# Patient Record
Sex: Male | Born: 1935 | Race: White | Hispanic: No | Marital: Married | Smoking: Never smoker
Health system: Southern US, Community
[De-identification: ages and names within clinical notes are randomized; demographics above are authoritative.]

## PROBLEM LIST (undated history)

## (undated) ENCOUNTER — Emergency Department: Discharge status: Absent without leave | Payer: Self-pay

## (undated) ENCOUNTER — Emergency Department: Discharge status: Absent without leave | Payer: Medicare Other

## (undated) DIAGNOSIS — N400 Enlarged prostate without lower urinary tract symptoms: Secondary | ICD-10-CM

---

## 2005-12-31 ENCOUNTER — Emergency Department: Payer: Self-pay | Admitting: Emergency Medicine

## 2005-12-31 ENCOUNTER — Other Ambulatory Visit: Payer: Self-pay

## 2006-11-08 ENCOUNTER — Ambulatory Visit: Payer: Self-pay | Admitting: Urology

## 2006-12-12 ENCOUNTER — Ambulatory Visit: Payer: Self-pay | Admitting: Internal Medicine

## 2006-12-14 ENCOUNTER — Ambulatory Visit: Payer: Self-pay | Admitting: Internal Medicine

## 2007-01-09 ENCOUNTER — Ambulatory Visit: Payer: Self-pay | Admitting: Internal Medicine

## 2007-01-11 ENCOUNTER — Ambulatory Visit: Payer: Self-pay | Admitting: Internal Medicine

## 2007-02-11 ENCOUNTER — Ambulatory Visit: Payer: Self-pay | Admitting: Internal Medicine

## 2007-04-13 ENCOUNTER — Ambulatory Visit: Payer: Self-pay | Admitting: Internal Medicine

## 2007-04-20 ENCOUNTER — Ambulatory Visit: Payer: Self-pay | Admitting: Internal Medicine

## 2007-04-24 ENCOUNTER — Ambulatory Visit: Payer: Self-pay | Admitting: Internal Medicine

## 2007-05-14 ENCOUNTER — Ambulatory Visit: Payer: Self-pay | Admitting: Internal Medicine

## 2007-07-14 ENCOUNTER — Ambulatory Visit: Payer: Self-pay | Admitting: Internal Medicine

## 2007-07-31 ENCOUNTER — Ambulatory Visit: Payer: Self-pay | Admitting: Internal Medicine

## 2007-08-13 ENCOUNTER — Ambulatory Visit: Payer: Self-pay | Admitting: Internal Medicine

## 2007-10-14 ENCOUNTER — Ambulatory Visit: Payer: Self-pay | Admitting: Internal Medicine

## 2007-10-30 ENCOUNTER — Ambulatory Visit: Payer: Self-pay | Admitting: Internal Medicine

## 2007-11-11 ENCOUNTER — Ambulatory Visit: Payer: Self-pay | Admitting: Internal Medicine

## 2007-11-23 ENCOUNTER — Ambulatory Visit: Payer: Self-pay | Admitting: Rheumatology

## 2008-01-05 ENCOUNTER — Emergency Department: Payer: Self-pay | Admitting: Emergency Medicine

## 2008-04-12 ENCOUNTER — Ambulatory Visit: Payer: Self-pay | Admitting: Internal Medicine

## 2009-12-31 ENCOUNTER — Ambulatory Visit: Payer: Self-pay

## 2013-08-26 ENCOUNTER — Emergency Department: Payer: Self-pay | Admitting: Emergency Medicine

## 2016-02-12 ENCOUNTER — Emergency Department
Admission: EM | Admit: 2016-02-12 | Discharge: 2016-02-13 | Disposition: A | Discharge status: Home / Self Care | Payer: Medicare Other | Attending: Emergency Medicine | Admitting: Emergency Medicine

## 2016-02-12 ENCOUNTER — Encounter: Payer: Self-pay | Admitting: Emergency Medicine

## 2016-02-12 DIAGNOSIS — N4889 Other specified disorders of penis: Secondary | ICD-10-CM | POA: Diagnosis present

## 2016-02-12 DIAGNOSIS — Z79899 Other long term (current) drug therapy: Secondary | ICD-10-CM | POA: Insufficient documentation

## 2016-02-12 DIAGNOSIS — N471 Phimosis: Secondary | ICD-10-CM | POA: Diagnosis not present

## 2016-02-12 DIAGNOSIS — Z7982 Long term (current) use of aspirin: Secondary | ICD-10-CM | POA: Diagnosis not present

## 2016-02-12 HISTORY — DX: Benign prostatic hyperplasia without lower urinary tract symptoms: N40.0

## 2016-02-12 NOTE — ED Notes (Signed)
Pt states "i had to go real bad, the pressure built up and it tore a new pathway out." pt states had urinary retention around 1400 today, with subsequent large urination. Pt states during urination foreskin to penis was torn. Pt is able to urinate without difficulty currently. Pt with small tear noted to underside of meatus.

## 2016-02-13 ENCOUNTER — Encounter: Payer: Self-pay | Admitting: Emergency Medicine

## 2016-02-13 LAB — URINALYSIS COMPLETE WITH MICROSCOPIC (ARMC ONLY)
BACTERIA UA: NONE SEEN
Bilirubin Urine: NEGATIVE
GLUCOSE, UA: NEGATIVE mg/dL
Ketones, ur: NEGATIVE mg/dL
Nitrite: NEGATIVE
PH: 5 (ref 5.0–8.0)
Protein, ur: NEGATIVE mg/dL
Specific Gravity, Urine: 1.009 (ref 1.005–1.030)

## 2016-02-13 LAB — BASIC METABOLIC PANEL
ANION GAP: 6 (ref 5–15)
BUN: 20 mg/dL (ref 6–20)
CALCIUM: 8.7 mg/dL — AB (ref 8.9–10.3)
CHLORIDE: 107 mmol/L (ref 101–111)
CO2: 28 mmol/L (ref 22–32)
Creatinine, Ser: 0.65 mg/dL (ref 0.61–1.24)
GFR calc non Af Amer: >60 mL/min (ref >60)
Glucose, Bld: 93 mg/dL (ref 65–99)
Potassium: 4 mmol/L (ref 3.5–5.1)
Sodium: 141 mmol/L (ref 135–145)

## 2016-02-13 NOTE — Discharge Instructions (Signed)
Follow-up with your doctor and UROLOGY.  SEEK Urgent MEDICAL CARE IF:  The torn area of skin does not heal, or it becomes more irritated, red, or bloody, develops pus or new swelling or pain Urination is difficult or painful or you can not urinate  Pain in the penis or lower abdomen develops You develop a fever. New Concerns arise

## 2016-02-13 NOTE — ED Provider Notes (Signed)
[facility] Emergency Department Provider Note  ____________________________________________  Time seen: Approximately 1:05 AM  I have reviewed the triage vital signs and the nursing notes.   HISTORY  Chief Complaint Penis Pain    HPI Jeffery Walsh. is a 82 y.o. 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  Patient reports that he's had slow increase in difficulty urinating because the hole in his foreskin has been slowly closing up and he usually has to use his fingers to pry it apart in order to urinate. Last night he went to urinate and he was unable to pry the foreskin apart, and he states that he had to go very badly and when he urinated the entire tip of his penis blew up like a balloon.  He now reports that he feels he can urinate fine, his bladder is emptying without issue but he did have a little bit of bleeding from the foreskin which is improved. He reports headache "put a bigger hole" in his foreskin.   Past Medical History  Diagnosis Date  . BPH (benign prostatic hyperplasia)     There are no active problems to display for this patient.   History reviewed. No pertinent past surgical history.  Current Outpatient Rx  Name  Route  Sig  Dispense  Refill  . aspirin 81 MG tablet   Oral   Take 81 mg by mouth daily.         . tamsulosin (FLOMAX) 0.4 MG CAPS capsule   Oral   Take 0.4 mg by mouth.           Allergies Review of patient's allergies indicates no known allergies.  History reviewed. No pertinent family history.  Social History Social History  Substance Use Topics  . Smoking status: Never Smoker   . Smokeless tobacco: Never Used  . Alcohol Use: No    Review of Systems Constitutional: No fever/chills Eyes: No visual changes. ENT: No sore throat. Cardiovascular: Denies chest pain. Respiratory: Denies shortness of breath. Gastrointestinal: No  abdominal pain.  No nausea, no vomiting.  No diarrhea.  No constipation. Genitourinary: See history of present illness Musculoskeletal: Negative for back pain. Skin: Negative for rash. Neurological: Negative for headaches, focal weakness or numbness.  10-point ROS otherwise negative.  ____________________________________________   PHYSICAL EXAM:  VITAL SIGNS: ED Triage Vitals  Enc Vitals Group     BP 02/12/16 2310 149/73 mmHg     Pulse Rate 02/12/16 2310 88     Resp 02/12/16 2310 16     Temp 02/12/16 2310 98.1 F (36.7 C)     Temp Source 02/12/16 2310 Oral     SpO2 02/12/16 2310 98 %     Weight 02/12/16 2310 150 lb (68.04 kg)     Height 02/12/16 2310 5\' 4"  (1.626 m)     Head Cir --      Peak Flow --      Pain Score 02/12/16 2310 2     Pain Loc --      Pain Edu? --      Excl. in [place]? --    Constitutional: Alert and oriented. Well appearing and in no acute distress.Very amicable, escorted by his daughter. Eyes: Conjunctivae are normal. PERRL. EOMI. Head: Atraumatic. Nose: No congestion/rhinnorhea. Mouth/Throat: Mucous membranes are moist.  Oropharynx non-erythematous. Neck: No stridor.  Cardiovascular: Normal rate, regular rhythm. Grossly normal heart sounds.  Good peripheral circulation. Respiratory:  Normal respiratory effort.  No retractions. Lungs CTAB. Gastrointestinal: Soft and nontender. No distention.  Normal shaft of the penis. The patient has a phimosis, no purulence or evidence of infection. It cannot be easily retracted down over the head of the penis but the patient reports that he has not been able to retract over his penis for years. At the distal portion, there is and appears to be a small tear in the foreskin consistent with the patient describes. There is no purulence, erythema or ongoing bleeding. Musculoskeletal: No lower extremity tenderness nor edema.  Neurologic:  Normal speech and language. No gross focal neurologic deficits are appreciated.Skin:  Skin  is warm, dry and intact. No rash noted. Psychiatric: Mood and affect are normal. Speech and behavior are normal.  ____________________________________________   LABS (all labs ordered are listed, but only abnormal results are displayed)  Labs Reviewed  URINALYSIS COMPLETEWITH MICROSCOPIC ([place]) - Abnormal; Notable for the following:    Color, Urine YELLOW (*)    APPearance HAZY (*)    Hgb urine dipstick 3+ (*)    Leukocytes, UA 1+ (*)    Squamous Epithelial / LPF 0-5 (*)    All other components within normal limits  BASIC METABOLIC PANEL - Abnormal; Notable for the following:    Calcium 8.7 (*)    All other components within normal limits   ____________________________________________  EKG   ____________________________________________  RADIOLOGY   ____________________________________________   PROCEDURES  Procedure(s) performed: None  Critical Care performed: No  ____________________________________________   INITIAL IMPRESSION / ASSESSMENT AND PLAN / ED COURSE  Pertinent labs & imaging results that were available during my care of the patient were reviewed by me and considered in my medical decision making (see chart for details).  Patient was followed appears to be apparent urinary obstruction secondary to some type of congenital or slow closure of the distal foreskin. Patient has had significant difficulty with urinating because of stricturing of the foreskin for years evidently. It appears that he likely ruptured the distal portion of his foreskin, but has no evidence of complication at this time. He is able to urinate well and empties the bladder with only 180 ML's resulting after voiding. He is awake alert, reports no further difficulty with urination and his labs appear quite normal. Expected mild blood is in the urinalysis. Discussed with the patient he will monitor his symptoms and follow-up with urology as I mentioned he may need further evaluation or  procedure to maintain patency.  Patient very agreeable. There is no evidence of paraphimosis, infection, or ongoing obstruction. ____________________________________________   FINAL CLINICAL IMPRESSION(S) / ED DIAGNOSES  Final diagnoses:  Congenital phimosis      Delman Kitten, MD 02/13/16 [phone]

## 2016-02-13 NOTE — ED Notes (Signed)
Pt assisted to use urinal.  3 bright red spots noted coming from patient's penis.  No other bleeding occurred at this time.

## 2016-02-15 ENCOUNTER — Telehealth: Payer: Self-pay | Admitting: Urology

## 2016-02-15 NOTE — Telephone Encounter (Signed)
Spoke with pt in reference to "penis problem". Pt stated that he went to the ER right after the "problem" happened. Pt would not go into further detail about problem and requested only a male physician to help. Pt was transferred to the front to make f/u appt.

## 2016-02-15 NOTE — Telephone Encounter (Signed)
Patient called the after hours nurse line on 02/12/16 at 8:49pm.  Patient states that he has a crack about an inch below the urethra opening that has opened up and now bleeding and urinating from it.  This occurred at approximately 8:00pm.  Patient states that he believes this is from pressure build up from foreskin.  Patient stated that he is uncomfortable but can tolerate.  Please contact patient to follow up.

## 2016-02-22 ENCOUNTER — Ambulatory Visit (INDEPENDENT_AMBULATORY_CARE_PROVIDER_SITE_OTHER): Payer: Medicare Other | Admitting: Urology

## 2016-02-22 ENCOUNTER — Encounter: Payer: Self-pay | Admitting: Urology

## 2016-02-22 VITALS — BP 106/68 | HR 99 | Ht 67.0 in | Wt 150.9 lb

## 2016-02-22 DIAGNOSIS — N471 Phimosis: Secondary | ICD-10-CM | POA: Diagnosis not present

## 2016-02-22 DIAGNOSIS — N419 Inflammatory disease of prostate, unspecified: Secondary | ICD-10-CM | POA: Insufficient documentation

## 2016-02-22 DIAGNOSIS — R3915 Urgency of urination: Secondary | ICD-10-CM | POA: Insufficient documentation

## 2016-02-22 DIAGNOSIS — M1990 Unspecified osteoarthritis, unspecified site: Secondary | ICD-10-CM | POA: Insufficient documentation

## 2016-02-22 DIAGNOSIS — N281 Cyst of kidney, acquired: Secondary | ICD-10-CM | POA: Insufficient documentation

## 2016-02-22 DIAGNOSIS — I6529 Occlusion and stenosis of unspecified carotid artery: Secondary | ICD-10-CM | POA: Insufficient documentation

## 2016-02-22 DIAGNOSIS — C8590 Non-Hodgkin lymphoma, unspecified, unspecified site: Secondary | ICD-10-CM | POA: Insufficient documentation

## 2016-02-22 DIAGNOSIS — E039 Hypothyroidism, unspecified: Secondary | ICD-10-CM | POA: Insufficient documentation

## 2016-02-22 DIAGNOSIS — I4510 Unspecified right bundle-branch block: Secondary | ICD-10-CM | POA: Insufficient documentation

## 2016-02-22 DIAGNOSIS — N529 Male erectile dysfunction, unspecified: Secondary | ICD-10-CM | POA: Insufficient documentation

## 2016-02-22 DIAGNOSIS — E785 Hyperlipidemia, unspecified: Secondary | ICD-10-CM | POA: Insufficient documentation

## 2016-02-22 LAB — URINALYSIS, COMPLETE
Bilirubin, UA: NEGATIVE
GLUCOSE, UA: NEGATIVE
Ketones, UA: NEGATIVE
Nitrite, UA: NEGATIVE
PROTEIN UA: NEGATIVE
Specific Gravity, UA: 1.02 (ref 1.005–1.030)
UUROB: 0.2 mg/dL (ref 0.2–1.0)
pH, UA: 5.5 (ref 5.0–7.5)

## 2016-02-22 LAB — MICROSCOPIC EXAMINATION
Epithelial Cells (non renal): >10 /hpf — AB (ref 0–10)
WBC, UA: >30 /hpf — AB (ref 0–?)

## 2016-02-22 LAB — BLADDER SCAN AMB NON-IMAGING: Scan Result: 16

## 2016-02-22 MED ORDER — CLOTRIMAZOLE-BETAMETHASONE 1-0.05 % EX CREA: 1.0000 "application " | TOPICAL_CREAM | Freq: Every day | CUTANEOUS | Status: DC | PRN

## 2016-02-22 NOTE — Progress Notes (Signed)
02/22/2016 10:10 AM   Jeffery Walsh. [birth year] PI:[id number]  Referring provider: Idelle Crouch, MD [facility] [place], [place] [address]  No chief complaint on file.   HPI:  1 - Phimosis - long h/o progressive phimosis with some ballooning of foreskin with void. Has beeen manually keeping open x years. On exam with pinpoint opening and dense phimotic ring. Glans not visibla at all. Brother had bad experience with dorsal slit procedure and pt is very hesitant towards any sort of surgical therapy (circumcsion) despite his relative excellent health. Givign trial of topical betamethasone 2017.   2 - Lower Urinary Tract Symptoms - on tamsulosin at baseline for mild LUTS. PVR 2017 <49mL.  PMH sig for ankel surgery, CEA. No strong blood thinners. No chest / abd surgery.  His PCP is Georgie Chard MD.  Today " Jeffery Walsh" is seen as new patient for above. He is referred by [facility] ER.    PMH: Past Medical History  Diagnosis Date  . BPH (benign prostatic hyperplasia)     Surgical History: No past surgical history on file.  Home Medications:    Medication List       This list is accurate as of: 02/22/16 10:10 AM.  Always use your most recent med list.               aspirin 81 MG tablet  Take 81 mg by mouth daily.     tamsulosin 0.4 MG Caps capsule  Commonly known as:  FLOMAX  Take 0.4 mg by mouth.        Allergies: No Known Allergies  Family History: No family history on file.  Social History:  reports that he has never smoked. He has never used smokeless tobacco. He reports that he does not drink alcohol or use illicit drugs.  ROS:    Urological Symptom Review  Patient is experiencing the following symptoms: Frequent urination   Review of Systems  Gastrointestinal (upper)  : Negative for upper GI symptoms  Gastrointestinal (lower) : Negative for lower GI symptoms  Constitutional : Negative for symptoms  Skin: Negative for  skin symptoms  Eyes: Negative for eye symptoms  Ear/Nose/Throat : Negative for Ear/Nose/Throat symptoms  Hematologic/Lymphatic: Negative for Hematologic/Lymphatic symptoms  Cardiovascular : Negative for cardiovascular symptoms  Respiratory : Negative for respiratory symptoms  Endocrine: Negative for endocrine symptoms  Musculoskeletal: Negative for musculoskeletal symptoms  Neurological: Negative for neurological symptoms  Psychologic: Negative for psychiatric symptoms      Physical Exam: There were no vitals taken for this visit.  Constitutional:  Alert and oriented, No acute distress.Very vigrous for age.  HEENT: [place] AT, moist mucus membranes.  Trachea midline, no masses. Cardiovascular: No clubbing, cyanosis, or edema. Respiratory: Normal respiratory effort, no increased work of breathing. GI: Abdomen is soft, nontender, nondistended, no abdominal masses GU: No CVA tenderness. Severe phimaosis, pinpoint opneing. Dense phimotic ring. Testes down w/o masses / hernias.  Skin: No rashes, bruises or suspicious lesions. Lymph: No cervical or inguinal adenopathy. Neurologic: Grossly intact, no focal deficits, moving all 4 extremities. Psychiatric: Normal mood and affect.  Laboratory Data: No results found for: WBC, HGB, HCT, MCV, PLT  Lab Results  Component Value Date   CREATININE 0.65 02/13/2016    No results found for: PSA  No results found for: TESTOSTERONE  No results found for: HGBA1C  Urinalysis    Component Value Date/Time   COLORURINE YELLOW* 02/13/2016 0027   APPEARANCEUR HAZY* 02/13/2016  0027   LABSPEC 1.009 02/13/2016 0027   PHURINE 5.0 02/13/2016 0027   GLUCOSEU NEGATIVE 02/13/2016 0027   HGBUR 3+* 02/13/2016 0027   BILIRUBINUR NEGATIVE 02/13/2016 0027   KETONESUR NEGATIVE 02/13/2016 0027   PROTEINUR NEGATIVE 02/13/2016 0027   NITRITE NEGATIVE 02/13/2016 0027   LEUKOCYTESUR 1+* 02/13/2016 0027    Pertinent Imaging: none  Assessment  & Plan:    1 - Phimosis - Pt with excellent functional status and minimal comorbidity. Frankly rec'd circumcsion. He is hesitant. Also discussed dorsal slit v. Topical steroid. He adamantly wants trial of steroid. Frankly discussed that this can at best stabilize current level of problem. Fortunatlye h eempties well.   2 - Lower Urinary Tract Symptoms - continue tamsulosin.   3 - RTC Urol prn.    No Follow-up on file.  Alexis Frock, [facility] [address], [place], [place] [address] [phone]

## 2016-03-04 ENCOUNTER — Ambulatory Visit: Payer: Self-pay

## 2016-03-22 ENCOUNTER — Emergency Department: Payer: Medicare Other

## 2016-03-22 ENCOUNTER — Encounter: Payer: Self-pay | Admitting: Emergency Medicine

## 2016-03-22 ENCOUNTER — Emergency Department
Admission: EM | Admit: 2016-03-22 | Discharge: 2016-03-22 | Disposition: A | Discharge status: Home / Self Care | Payer: Medicare Other | Attending: Emergency Medicine | Admitting: Emergency Medicine

## 2016-03-22 DIAGNOSIS — R0789 Other chest pain: Secondary | ICD-10-CM | POA: Diagnosis present

## 2016-03-22 DIAGNOSIS — Y929 Unspecified place or not applicable: Secondary | ICD-10-CM | POA: Insufficient documentation

## 2016-03-22 DIAGNOSIS — Z8679 Personal history of other diseases of the circulatory system: Secondary | ICD-10-CM | POA: Insufficient documentation

## 2016-03-22 DIAGNOSIS — Y9389 Activity, other specified: Secondary | ICD-10-CM | POA: Diagnosis not present

## 2016-03-22 DIAGNOSIS — S20211A Contusion of right front wall of thorax, initial encounter: Secondary | ICD-10-CM | POA: Insufficient documentation

## 2016-03-22 DIAGNOSIS — E039 Hypothyroidism, unspecified: Secondary | ICD-10-CM | POA: Diagnosis not present

## 2016-03-22 DIAGNOSIS — Z7982 Long term (current) use of aspirin: Secondary | ICD-10-CM | POA: Insufficient documentation

## 2016-03-22 DIAGNOSIS — S300XXA Contusion of lower back and pelvis, initial encounter: Secondary | ICD-10-CM | POA: Diagnosis not present

## 2016-03-22 DIAGNOSIS — W19XXXA Unspecified fall, initial encounter: Secondary | ICD-10-CM

## 2016-03-22 DIAGNOSIS — W108XXA Fall (on) (from) other stairs and steps, initial encounter: Secondary | ICD-10-CM | POA: Diagnosis not present

## 2016-03-22 DIAGNOSIS — Y999 Unspecified external cause status: Secondary | ICD-10-CM | POA: Insufficient documentation

## 2016-03-22 DIAGNOSIS — S20229A Contusion of unspecified back wall of thorax, initial encounter: Secondary | ICD-10-CM

## 2016-03-22 DIAGNOSIS — E785 Hyperlipidemia, unspecified: Secondary | ICD-10-CM | POA: Insufficient documentation

## 2016-03-22 MED ORDER — NAPROXEN 500 MG PO TABS
500.0000 mg | ORAL_TABLET | Freq: Once | ORAL | Status: AC
  Administered 2016-03-22: 500 mg via ORAL

## 2016-03-22 MED ORDER — OXYCODONE-ACETAMINOPHEN 5-325 MG PO TABS: 1.0000 | ORAL_TABLET | Freq: Four times a day (QID) | ORAL | Status: DC | PRN

## 2016-03-22 MED ORDER — NAPROXEN 500 MG PO TABS
ORAL_TABLET | ORAL | Status: AC
  Administered 2016-03-22: 500 mg via ORAL
  Filled 2016-03-22: qty 1

## 2016-03-22 MED ORDER — OXYCODONE-ACETAMINOPHEN 5-325 MG PO TABS
1.0000 | ORAL_TABLET | Freq: Once | ORAL | Status: AC
  Administered 2016-03-22: 1 via ORAL

## 2016-03-22 MED ORDER — OXYCODONE-ACETAMINOPHEN 5-325 MG PO TABS
ORAL_TABLET | ORAL | Status: AC
  Administered 2016-03-22: 1 via ORAL
  Filled 2016-03-22: qty 1

## 2016-03-22 NOTE — ED Notes (Signed)
Pt tripped today causing him to fall and injured right side ribs. No loc, denies any dizziness at time of fall.

## 2016-03-22 NOTE — Discharge Instructions (Signed)
You may take Tylenol or Motrin for mild to moderate pain.  You may take Percocet for severe pain, but do not drive within 8 hours of taking Percocet.  Please also take precautions to prevent falls as this medication may make you woozy or unsteady on your feet.  You may apply ice or heat, whichever feels better, to your back and chest wall for 10 minutes every 2-3 hours.  Use the incentive spirometer every hour when you are awake to prevent pneumonia.  Return to the emergency department if you develop cough, fever, changes in mental status, severe pain, shortness of breath, vomiting, or any other symptoms concerning to you. Back Injury Prevention Back injuries can be very painful. They can also be difficult to heal. After having one back injury, you are more likely to injure your back again. It is important to learn how to avoid injuring or re-injuring your back. The following tips can help you to prevent a back injury. WHAT SHOULD I KNOW ABOUT PHYSICAL FITNESS?  Exercise for 30 minutes per day on most days of the week or as directed by your health care provider. Make sure to:  Do aerobic exercises, such as walking, jogging, biking, or swimming.  Do exercises that increase balance and strength, such as tai chi and yoga. These can decrease your risk of falling and injuring your back.  Do stretching exercises to help with flexibility.  Try to develop strong abdominal muscles. Your abdominal muscles provide a lot of the support that is needed by your back.  Maintain a healthy weight. This helps to decrease your risk of a back injury. WHAT SHOULD I KNOW ABOUT MY DIET?  Talk with your health care provider about your overall diet. Take supplements and vitamins only as directed by your health care provider.  Talk with your health care provider about how much calcium and vitamin D you need each day. These nutrients help to prevent weakening of the bones (osteoporosis). Osteoporosis can cause broken  (fractured) bones, which lead to back pain.  Include good sources of calcium in your diet, such as dairy products, green leafy vegetables, and products that have had calcium added to them (fortified).  Include good sources of vitamin D in your diet, such as milk and foods that are fortified with vitamin D. WHAT SHOULD I KNOW ABOUT MY POSTURE?  Sit up straight and stand up straight. Avoid leaning forward when you sit or hunching over when you stand.  Choose chairs that have good low-back (lumbar) support.  If you work at a desk, sit close to it so you do not need to lean over. Keep your chin tucked in. Keep your neck drawn back, and keep your elbows bent at a right angle. Your arms should look like the letter "L."  Sit high and close to the steering wheel when you drive. Add a lumbar support to your car seat, if needed.  Avoid sitting or standing in one position for very long. Take breaks to get up, stretch, and walk around at least one time every hour. Take breaks every hour if you are driving for long periods of time.  Sleep on your side with your knees slightly bent, or sleep on your back with a pillow under your knees. Do not lie on the front of your body to sleep. WHAT SHOULD I KNOW ABOUT LIFTING, TWISTING, AND REACHING? Lifting and Heavy Lifting  Avoid heavy lifting, especially repetitive heavy lifting. If you must do heavy lifting:  Stretch  before lifting.  Work slowly.  Rest between lifts.  Use a tool such as a cart or a dolly to move objects if one is available.  Make several small trips instead of carrying one heavy load.  Ask for help when you need it, especially when moving big objects.  Follow these steps when lifting:  Stand with your feet shoulder-width apart.  Get as close to the object as you can. Do not try to pick up a heavy object that is far from your body.  Use handles or lifting straps if they are available.  Bend at your knees. Squat down, but keep  your heels off the floor.  Keep your shoulders pulled back, your chin tucked in, and your back straight.  Lift the object slowly while you tighten the muscles in your legs, abdomen, and buttocks. Keep the object as close to the center of your body as possible.  Follow these steps when putting down a heavy load:  Stand with your feet shoulder-width apart.  Lower the object slowly while you tighten the muscles in your legs, abdomen, and buttocks. Keep the object as close to the center of your body as possible.  Keep your shoulders pulled back, your chin tucked in, and your back straight.  Bend at your knees. Squat down, but keep your heels off the floor.  Use handles or lifting straps if they are available. Twisting and Reaching  Avoid lifting heavy objects above your waist.  Do not twist at your waist while you are lifting or carrying a load. If you need to turn, move your feet.  Do not bend over without bending at your knees.  Avoid reaching over your head, across a table, or for an object on a high surface. WHAT ARE SOME OTHER TIPS?  Avoid wet floors and icy ground. Keep sidewalks clear of ice to prevent falls.  Do not sleep on a mattress that is too soft or too hard.  Keep items that are used frequently within easy reach.  Put heavier objects on shelves at waist level, and put lighter objects on lower or higher shelves.  Find ways to decrease your stress, such as exercise, massage, or relaxation techniques. Stress can build up in your muscles. Tense muscles are more vulnerable to injury.  Talk with your health care provider if you feel anxious or depressed. These conditions can make back pain worse.  Wear flat heel shoes with cushioned soles.  Avoid sudden movements.  Use both shoulder straps when carrying a backpack.  Do not use any tobacco products, including cigarettes, chewing tobacco, or electronic cigarettes. If you need help quitting, ask your health care  provider.   This information is not intended to replace advice given to you by your health care provider. Make sure you discuss any questions you have with your health care provider.   Document Released: 10/06/2004 Document Revised: 01/13/2015 Document Reviewed: 09/02/2014 Elsevier Interactive Patient Education [employer].

## 2016-03-22 NOTE — ED Provider Notes (Signed)
[facility] Emergency Department Provider Note  ____________________________________________  Time seen: Approximately 8:01 PM  I have reviewed the triage vital signs and the nursing notes.   HISTORY  Chief Complaint Rib Injury    HPI Jeffery Walsh. is a 82 y.o. male , otherwise healthy and living independently, presenting with back pain and right lateral wall pain after a fall. The patient reports that he was carrying groceries when he missed a step, falling backwards and landing on a concrete step edge. He did not hit his head or lose consciousness. He is not anticoagulated. He has mid back pain without any numbness tingling or weakness, urinary incontinence or retention, fecal incontinence or retention. He also has right lateral thoracic wall pain that is worse with inspiration. He is not feeling short of breath. He does not have any central chest pain. At no point did he have any chest pain, shortness of breath or palpitations. No lightheadedness or syncope today. No recent illness.The patient was able to ambulate after his fall.   Past Medical History  Diagnosis Date  . BPH (benign prostatic hyperplasia)     Patient Active Problem List   Diagnosis Date Noted  . Phimosis 02/22/2016  . Urinary urgency 02/22/2016  . ED (erectile dysfunction) of organic origin 02/22/2016  . Carotid artery thrombosis 02/22/2016  . HLD (hyperlipidemia) 02/22/2016  . Adult hypothyroidism 02/22/2016  . Arthritis, degenerative 02/22/2016  . Lymphoma ([place]) 02/22/2016  . Kidney cysts 02/22/2016  . Prostatitis 02/22/2016  . Bundle branch block, right 02/22/2016    History reviewed. No pertinent past surgical history.  Current Outpatient Rx  Name  Route  Sig  Dispense  Refill  . aspirin 81 MG tablet   Oral   Take 81 mg by mouth daily.         . clotrimazole-betamethasone (LOTRISONE) cream   Topical   Apply 1 application topically daily as needed.   30 g   11    . tamsulosin (FLOMAX) 0.4 MG CAPS capsule   Oral   Take 0.4 mg by mouth.           Allergies Aspirin  No family history on file.  Social History Social History  Substance Use Topics  . Smoking status: Never Smoker   . Smokeless tobacco: Never Used  . Alcohol Use: No    Review of Systems Constitutional: No fever/chills.No lightheadedness or syncope. No loss of consciousness. Positive fall. Eyes: No visual changes. No blurred or double vision. ENT: No sore throat. No congestion or rhinorrhea. Cardiovascular: Denies chest pain. Denies palpitations. Respiratory: Denies shortness of breath.  No cough. Gastrointestinal: No abdominal pain.  No nausea, no vomiting.  No diarrhea.  No constipation. Genitourinary: Negative for dysuria. Musculoskeletal: Positive for back pain and right chest wall pain. Negative for hip pain. Skin: Negative for rash. Neurological: Negative for headaches. No focal numbness, tingling or weakness. No urinary or fecal incontinence or retention.  10-point ROS otherwise negative.  ____________________________________________   PHYSICAL EXAM:  VITAL SIGNS: ED Triage Vitals  Enc Vitals Group     BP 03/22/16 1804 140/51 mmHg     Pulse Rate 03/22/16 1804 78     Resp 03/22/16 1804 18     Temp 03/22/16 1804 98.3 F (36.8 C)     Temp Source 03/22/16 1804 Oral     SpO2 03/22/16 1804 98 %     Weight 03/22/16 1804 147 lb (66.679 kg)     Height 03/22/16 1804  5\' 4"  (1.626 m)     Head Cir --      Peak Flow --      Pain Score 03/22/16 1809 9     Pain Loc --      Pain Edu? --      Excl. in [place]? --     Constitutional: Alert and oriented. Well appearing and in no acute distress. Answers questions appropriately.GCS is 15. Eyes: Conjunctivae are normal.  EOMI. No scleral icterus. Head: Atraumatic.No raccoon eyes. No Battle sign. Nose: No congestion/rhinnorhea. Swelling over the nose. No septal hematoma. Mouth/Throat: Mucous membranes are moist. No  malocclusion or dental injury. Neck: No stridor.  Supple.  No midline C-spine tenderness to palpation, step-offs or deformities. Cardiovascular: Normal rate, regular rhythm. No murmurs, rubs or gallops.  Respiratory: Normal respiratory effort.  No accessory muscle use or retractions. Lungs CTAB.  No wheezes, rales or ronchi. There is an area of erythema or early bruising that is 4 x 4 inches on the right lateral chest wall without any skin disruption or crepitus, do not feel any unstable ribs. Gastrointestinal: Soft, nontender and nondistended.  No guarding or rebound.  No peritoneal signs. Musculoskeletal: No LE edema. Pelvis is stable. Full range of motion of bilateral hips without pain. Diffuse tenderness to palpation in the lower thoracic spine and the upper lumbar spine in the midline without obvious step-offs or deformities, no overlying ecchymosis, swelling or skin disruption. Neurologic:  A&Ox3.  Speech is clear.  Face and smile are symmetric.  EOMI.  Moves all extremities well. Normal sensation to the legs bilaterally. Skin:  Skin is warm, dry and intact. No rash noted. Psychiatric: Mood and affect are normal. Speech and behavior are normal.  Normal judgement  ____________________________________________   LABS (all labs ordered are listed, but only abnormal results are displayed)  Labs Reviewed - No data to display ____________________________________________  EKG  Not indicated ____________________________________________  RADIOLOGY  Dg Ribs Unilateral W/chest Right  03/22/2016  CLINICAL DATA:  Trip and fall with right-sided chest pain, initial encounter EXAM: RIGHT RIBS AND CHEST - 3+ VIEW COMPARISON:  None. FINDINGS: Cardiac shadow is within normal limits. The lungs are clear bilaterally. No pneumothorax is noted. Mild irregularity of the right eighth and ninth ribs is noted posteriorly. These appear to be related to healed rib fractures. No definitive acute fractures are  identified. Degenerative change of the thoracic spine is seen. IMPRESSION: Old healed right rib fractures.  No acute abnormality is noted. Electronically Signed   By: Inez Catalina M.D.   On: 03/22/2016 18:51   Ct Chest Wo Contrast  03/22/2016  CLINICAL DATA:  Trip and fall today with right-sided chest pain, initial encounter EXAM: CT CHEST WITHOUT CONTRAST TECHNIQUE: Multidetector CT imaging of the chest was performed following the standard protocol without IV contrast. COMPARISON:  None. FINDINGS: Cardiovascular: Diffuse aortic calcifications are noted without aneurysmal dilatation. No periaortic abnormality is seen. The cardiac structures are within normal limits. Mild coronary calcifications are noted. Mediastinum/Nodes: No significant lymphadenopathy is seen. The thoracic inlet is unremarkable. Lungs/Pleura: Lungs are well aerated bilaterally. No focal infiltrate or sizable effusion is seen. No parenchymal nodules are noted. Upper Abdomen: No acute abnormality noted. A left renal cyst is seen which measures 8.2 cm. Musculoskeletal: Degenerative changes of the thoracic spine are noted. Old rib fractures are again seen on the right. No acute fracture is identified. IMPRESSION: No acute abnormality noted. Electronically Signed   By: Inez Catalina M.D.   On:  03/22/2016 20:44   Ct Cervical Spine Wo Contrast  03/22/2016  CLINICAL DATA:  Recent fall on concrete with neck pain, initial encounter EXAM: CT CERVICAL SPINE WITHOUT CONTRAST TECHNIQUE: Multidetector CT imaging of the cervical spine was performed without intravenous contrast. Multiplanar CT image reconstructions were also generated. COMPARISON:  None. FINDINGS: Seven cervical segments are well visualized. Vertebral body height is well maintained. Multilevel osteophytic changes are noted from C4-C7. Facet hypertrophic changes are noted at all levels. No acute fracture or acute facet abnormality is noted. The surrounding soft tissues are within normal  limits. IMPRESSION: Multilevel degenerative change without acute abnormality. Electronically Signed   By: Inez Catalina M.D.   On: 03/22/2016 20:51   Ct Thoracic Spine Wo Contrast  03/22/2016  CLINICAL DATA:  Recent fall on concrete slab with right-sided chest pain, initial encounter EXAM: CT THORACIC SPINE WITHOUT CONTRAST TECHNIQUE: Multidetector CT imaging of the thoracic spine was performed without intravenous contrast administration. Multiplanar CT image reconstructions were also generated. COMPARISON:  None. FINDINGS: Vertebral body height is well maintained. Multilevel degenerative changes are seen. No acute compression deformity is seen. No paraspinal mass lesion is noted. IMPRESSION: Degenerative changes of the thoracic spine are noted. No acute compression deformity is seen. Electronically Signed   By: Inez Catalina M.D.   On: 03/22/2016 20:47   Ct Lumbar Spine Wo Contrast  03/22/2016  CLINICAL DATA:  Recent fall on concrete with low back pain, initial encounter EXAM: CT LUMBAR SPINE WITHOUT CONTRAST TECHNIQUE: Multidetector CT imaging of the lumbar spine was performed without intravenous contrast administration. Multiplanar CT image reconstructions were also generated. COMPARISON:  None. FINDINGS: Five lumbar type vertebral bodies are well visualized. Vertebral body height is well maintained. Multilevel disc space narrowing is noted with vacuum disc phenomena. Some mild retrolisthesis of L2 on L3 and L1 on L2 is noted of a degenerative nature. Significant facet hypertrophic changes with ligamentous hypertrophy as well as some disc bulging is noted at L4-5 with significant central canal stenosis as well as bilateral neural foraminal narrowing. IMPRESSION: No acute abnormality is noted. Multilevel degenerative changes are noted worst at L4-5 with significant central canal stenosis. Electronically Signed   By: Inez Catalina M.D.   On: 03/22/2016 20:50     ____________________________________________   PROCEDURES  Procedure(s) performed: None  Critical Care performed: No ____________________________________________   INITIAL IMPRESSION / ASSESSMENT AND PLAN / ED COURSE  Pertinent labs & imaging results that were available during my care of the patient were reviewed by me and considered in my medical decision making (see chart for details).  82 y.o. male after mechanical fall presenting with lower thoracic and upper lumbar pain as well as right lateral chest wall pain. The patient is significantly uncomfortable and I'll treat him symptomatically for his pain. We will get CT scans of the thoracic and lumbar spine, as well as of the chest to evaluate for rib fractures or spine fractures. At this time, the patient does not have any neurologic deficits.  ----------------------------------------- 9:12 PM on 03/22/2016 -----------------------------------------  The patient's trauma evaluation is negative and he does not have any acute fractures in the thoracic, lumbar or cervical spine, nor does he have any acute rib fractures. Given his age and the mother pain that he has however, I will give him an incentive spirometer to encourage him to prevent pneumonia. He will follow-up with his primary care physician closely. Return precautions and follow-up instructions were discussed. ____________________________________________  FINAL CLINICAL IMPRESSION(S) / ED DIAGNOSES  Final diagnoses:  Right-sided chest wall pain  Back contusion, unspecified laterality, initial encounter  Chest wall contusion, right, initial encounter  Fall, initial encounter      NEW MEDICATIONS STARTED DURING THIS VISIT:  New Prescriptions   No medications on file     Eula Listen, MD 03/22/16 2112

## 2016-03-22 NOTE — ED Notes (Signed)
Pt reports falling on back this afternoon onto concrete slab, reports right rib and back pain.  Pain increased with movement.  Denies SOB

## 2016-04-19 ENCOUNTER — Encounter: Payer: Self-pay | Admitting: Urology

## 2016-04-19 ENCOUNTER — Ambulatory Visit (INDEPENDENT_AMBULATORY_CARE_PROVIDER_SITE_OTHER): Payer: Medicare Other | Admitting: Urology

## 2016-04-19 VITALS — BP 146/68 | HR 83 | Ht 65.0 in | Wt 145.6 lb

## 2016-04-19 DIAGNOSIS — R3915 Urgency of urination: Secondary | ICD-10-CM | POA: Diagnosis not present

## 2016-04-19 LAB — MICROSCOPIC EXAMINATION

## 2016-04-19 LAB — URINALYSIS, COMPLETE
Bilirubin, UA: NEGATIVE
GLUCOSE, UA: NEGATIVE
NITRITE UA: NEGATIVE
UUROB: 0.2 mg/dL (ref 0.2–1.0)
pH, UA: 5 (ref 5.0–7.5)

## 2016-04-19 LAB — BLADDER SCAN AMB NON-IMAGING: SCAN RESULT: 40

## 2016-04-19 MED ORDER — ALFUZOSIN HCL ER 10 MG PO TB24: 10.0000 mg | ORAL_TABLET | Freq: Every day | ORAL | 11 refills | Status: DC

## 2016-04-19 NOTE — Progress Notes (Signed)
04/19/2016 3:18 PM   Jeffery Kudo Jr. 24-Apr-1918 [id number]  Referring provider: Idelle Crouch, MD [facility] [place], [place] [address]  Chief Complaint  Patient presents with  . Urinary Frequency    HPI: Jeffery Walsh is a 82yo seen today for urinary frequency and nocturia. He has longstanding LUTS and is currently on flomax. His LUTS have been present for over 10 years. He denies dysuria, urgency, urge incontinence. Hematuria. He notes if he does not drink any fluids at noon then he does not get up at night. He also has difficulty retracting his foreskin.    PMH: Past Medical History:  Diagnosis Date  . BPH (benign prostatic hyperplasia)     Surgical History: No past surgical history on file.  Home Medications:    Medication List       Accurate as of 04/19/16  3:18 PM. Always use your most recent med list.          aspirin 81 MG tablet Take 81 mg by mouth daily.   clotrimazole-betamethasone cream Commonly known as:  LOTRISONE Apply 1 application topically daily as needed.   naproxen sodium 220 MG tablet Commonly known as:  ANAPROX Take by mouth.   oxyCODONE-acetaminophen 5-325 MG tablet Commonly known as:  PERCOCET/ROXICET Take 1 tablet by mouth every 6 (six) hours as needed for severe pain.   tamsulosin 0.4 MG Caps capsule Commonly known as:  FLOMAX Take 0.4 mg by mouth.       Allergies:  Allergies  Allergen Reactions  . Aspirin Nausea Only    In high doses    Family History: No family history on file.  Social History:  reports that he has never smoked. He has never used smokeless tobacco. He reports that he does not drink alcohol or use drugs.  ROS: UROLOGY Frequent Urination?: Yes Hard to postpone urination?: Yes Burning/pain with urination?: Yes Get up at night to urinate?: Yes Leakage of urine?: Yes Urine stream starts and stops?: Yes Trouble starting stream?: No Do you have to strain to urinate?:  No Blood in urine?: No Urinary tract infection?: No Sexually transmitted disease?: No Injury to kidneys or bladder?: No Painful intercourse?: No Weak stream?: No Erection problems?: No Penile pain?: Yes  Gastrointestinal Nausea?: No Vomiting?: No Indigestion/heartburn?: No Diarrhea?: No Constipation?: Yes  Constitutional Fever: No Night sweats?: Yes Weight loss?: No Fatigue?: No  Skin Skin rash/lesions?: No Itching?: No  Eyes Blurred vision?: No Double vision?: No  Ears/Nose/Throat Sore throat?: No Sinus problems?: No  Hematologic/Lymphatic Swollen glands?: No Easy bruising?: No  Cardiovascular Leg swelling?: No Chest pain?: No  Respiratory Cough?: No Shortness of breath?: No  Endocrine Excessive thirst?: Yes  Musculoskeletal Back pain?: Yes Joint pain?: Yes  Neurological Headaches?: No Dizziness?: No  Psychologic Depression?: No Anxiety?: Yes  Physical Exam: BP (!) 146/68   Pulse 83   Ht 5\' 5"  (1.651 m)   Wt 66 kg (145 lb 9.6 oz)   BMI 24.23 kg/m   Constitutional:  Alert and oriented, No acute distress. HEENT: [place] AT, moist mucus membranes.  Trachea midline, no masses. Cardiovascular: No clubbing, cyanosis, or edema. Respiratory: Normal respiratory effort, no increased work of breathing. GI: Abdomen is soft, nontender, nondistended, no abdominal masses GU: No CVA tenderness. Dense phimosis. No masses/lesions on penis, testes, scrotum.  Skin: No rashes, bruises or suspicious lesions. Lymph: No cervical or inguinal adenopathy. Neurologic: Grossly intact, no focal deficits, moving all 4 extremities. Psychiatric: Normal  mood and affect.  Laboratory Data: No results found for: WBC, HGB, HCT, MCV, PLT  Lab Results  Component Value Date   CREATININE 0.65 02/13/2016    No results found for: PSA  No results found for: TESTOSTERONE  No results found for: HGBA1C  Urinalysis    Component Value Date/Time   COLORURINE YELLOW (A)  02/13/2016 0027   APPEARANCEUR Cloudy (A) 02/22/2016 1030   LABSPEC 1.009 02/13/2016 0027   PHURINE 5.0 02/13/2016 0027   GLUCOSEU Negative 02/22/2016 1030   HGBUR 3+ (A) 02/13/2016 0027   BILIRUBINUR Negative 02/22/2016 1030   KETONESUR NEGATIVE 02/13/2016 0027   PROTEINUR Negative 02/22/2016 1030   PROTEINUR NEGATIVE 02/13/2016 0027   NITRITE Negative 02/22/2016 1030   NITRITE NEGATIVE 02/13/2016 0027   LEUKOCYTESUR 3+ (A) 02/22/2016 1030    Pertinent Imaging: none  Assessment & Plan:    1. Urinary frequency/nocturia -uroxatral 10mg  daily  - Urinalysis, Complete  2. Phimosis - I recommended circumcision and the patient refused. He does not want a try steroid cream.   No Follow-up on file.  Nicolette Bang, MD  [facility] [address] [place], [place] [address] [phone]

## 2016-04-22 LAB — CULTURE, URINE COMPREHENSIVE

## 2016-05-24 ENCOUNTER — Ambulatory Visit (INDEPENDENT_AMBULATORY_CARE_PROVIDER_SITE_OTHER): Payer: Medicare Other | Admitting: Urology

## 2016-05-24 DIAGNOSIS — N471 Phimosis: Secondary | ICD-10-CM | POA: Diagnosis not present

## 2016-05-24 DIAGNOSIS — R300 Dysuria: Secondary | ICD-10-CM | POA: Diagnosis not present

## 2016-05-24 LAB — URINALYSIS, COMPLETE
BILIRUBIN UA: NEGATIVE
GLUCOSE, UA: NEGATIVE
KETONES UA: NEGATIVE
LEUKOCYTES UA: NEGATIVE
Nitrite, UA: NEGATIVE
PROTEIN UA: NEGATIVE
SPEC GRAV UA: 1.025 (ref 1.005–1.030)
Urobilinogen, Ur: 0.2 mg/dL (ref 0.2–1.0)
pH, UA: 5.5 (ref 5.0–7.5)

## 2016-05-24 LAB — MICROSCOPIC EXAMINATION: Bacteria, UA: NONE SEEN

## 2016-05-24 LAB — BLADDER SCAN AMB NON-IMAGING

## 2016-05-24 MED ORDER — CLOTRIMAZOLE-BETAMETHASONE 1-0.05 % EX CREA: 1.0000 "application " | TOPICAL_CREAM | Freq: Two times a day (BID) | CUTANEOUS | 0 refills | Status: DC

## 2016-05-24 MED ORDER — CEPHALEXIN 500 MG PO CAPS: 500.0000 mg | ORAL_CAPSULE | Freq: Two times a day (BID) | ORAL | 0 refills | Status: DC

## 2016-05-24 NOTE — Progress Notes (Signed)
I saw the patient briefly because of his severe phimosis. Due to the nurses inability to get the Foley catheter in she called my attention over to the patient. I used a small Kelly clamp and dilated the phimosis enough so that we could see the glans and the urethral meatus. We subsequently performed a straight cath to get a clean urine sample which was sent for culture. The patient opted to have a Foley catheter placed this time and so that straight was exchanged for a 16 [place] Foley catheter. We opted to give the patient some steroid cream for his phimosis. We also started the patient on Keflex 500 mg twice a day. Patient will return in 1 week to have his Foley catheter removed. I shortly recommended that the patient consider a dorsal slit so that this problem can be avoided in the future.

## 2016-05-24 NOTE — Progress Notes (Signed)
Patient came in today stating that he is having dysuria and frequency. Patient has a history of phimosis so he was asked if he could retract his foreskin and he stated he cannot. It was explained that we would want a cath specimen to avoid contamination.  Upon exam phimosis was noted could not visualize patient's urethra. Dr. Louis Meckel was asked into the room to examine patient.  PVR was preformed 148ml  In and Out Catheterization  Patient is present today for a I & O catheterization due to phimosis and dysuria. Patient was cleaned and prepped in a sterile fashion with betadine and Lidocaine 2% jelly was instilled into the urethra.  A 14FR cath was inserted after Dr. Louis Meckel was able to open the foreskin with forcepts , 66ml of urine return was noted, urine was yellow in color. A clean urine sample was collected for UA and culture. Bladder was drained  And catheter was removed with out difficulty.    Preformed by: Fonnie Jarvis, CMA and Burman Nieves, MD  After discussion with patient it was agreed for patient to have a foley placed for a week per Dr. Louis Meckel and a script for antibiotics and cream was sent to patient's pharmacy. Patient and patient's son were given detailed instruction on cath care.  Simple Catheter Placement  Due to urinary retention patient is present today for a foley cath placement.  Patient was cleaned and prepped in a sterile fashion with betadine and lidocaine jelly 2% was instilled into the urethra.  A 16 FR foley catheter was inserted, urine return was noted  47ml, urine was yellow  in color.  The balloon was filled with 10cc of sterile water.  A leg bag was attached for drainage. Patient was also given a night bag to take home and was given instruction on how to change from one bag to another.  Patient was given instruction on proper catheter care.  Patient tolerated well, no complications were noted   Preformed by: Fonnie Jarvis, CMA  Additional notes/ Follow up: 1 wk foley  removal and discussion of dorsal slit

## 2016-05-29 LAB — CULTURE, URINE COMPREHENSIVE

## 2016-05-30 ENCOUNTER — Ambulatory Visit: Payer: Medicare Other

## 2016-05-30 DIAGNOSIS — N471 Phimosis: Secondary | ICD-10-CM

## 2016-05-30 NOTE — Progress Notes (Signed)
Pt called the after hours line over the weekend c/o urine coming out around his catheter. Per pt after hours triage advised pt to come into our office first thing this morning.  Per Dr. Tresa Moore pt foley could be removed today. Reinforced with pt to keep f/u with MD to discuss dorsal slit. Pt and daughter voiced understanding.

## 2016-05-31 ENCOUNTER — Ambulatory Visit: Payer: Medicare Other

## 2016-06-14 ENCOUNTER — Encounter: Payer: Self-pay | Admitting: Urology

## 2016-06-14 ENCOUNTER — Ambulatory Visit (INDEPENDENT_AMBULATORY_CARE_PROVIDER_SITE_OTHER): Payer: Medicare Other | Admitting: Urology

## 2016-06-14 VITALS — BP 119/62 | HR 80 | Ht 64.0 in | Wt 147.5 lb

## 2016-06-14 DIAGNOSIS — N471 Phimosis: Secondary | ICD-10-CM

## 2016-06-14 MED ORDER — CLOTRIMAZOLE-BETAMETHASONE 1-0.05 % EX CREA: 1.0000 "application " | TOPICAL_CREAM | Freq: Two times a day (BID) | CUTANEOUS | 3 refills | Status: DC

## 2016-06-14 NOTE — Progress Notes (Signed)
06/14/2016 2:13 PM   Dewain Penning. May 26, 1918 [id number]  Referring provider: Idelle Crouch, MD [facility] [place], [place] [address]  Chief Complaint  Patient presents with  . Follow-up    Phimosis    HPI: Mr Jeffery Walsh is a 82yo here for followup for phimosis. He had a foley placed for retention and it has since been removed. He is using the cream BID.    PMH: Past Medical History:  Diagnosis Date  . BPH (benign prostatic hyperplasia)     Surgical History: No past surgical history on file.  Home Medications:    Medication List       Accurate as of 06/14/16  2:13 PM. Always use your most recent med list.          alfuzosin 10 MG 24 hr tablet Commonly known as:  UROXATRAL Take 1 tablet (10 mg total) by mouth daily with breakfast.   aspirin 81 MG tablet Take 81 mg by mouth daily.   clotrimazole-betamethasone cream Commonly known as:  LOTRISONE Apply 1 application topically 2 (two) times daily.   naproxen sodium 220 MG tablet Commonly known as:  ANAPROX Take by mouth.   tamsulosin 0.4 MG Caps capsule Commonly known as:  FLOMAX Take by mouth.       Allergies:  Allergies  Allergen Reactions  . Aspirin Nausea Only    In high doses    Family History: No family history on file.  Social History:  reports that he has never smoked. He has never used smokeless tobacco. He reports that he does not drink alcohol or use drugs.  ROS: UROLOGY Frequent Urination?: Yes Hard to postpone urination?: Yes Burning/pain with urination?: No Get up at night to urinate?: Yes Leakage of urine?: Yes Urine stream starts and stops?: Yes Trouble starting stream?: No Do you have to strain to urinate?: No Blood in urine?: No Urinary tract infection?: No Sexually transmitted disease?: No Injury to kidneys or bladder?: No Painful intercourse?: No Weak stream?: No Erection problems?: No Penile pain?: No  Gastrointestinal Nausea?:  No Vomiting?: No Indigestion/heartburn?: No Diarrhea?: No Constipation?: No  Constitutional Fever: No Night sweats?: No Weight loss?: No Fatigue?: No  Skin Skin rash/lesions?: No Itching?: No  Eyes Blurred vision?: No Double vision?: No  Ears/Nose/Throat Sore throat?: No Sinus problems?: No  Hematologic/Lymphatic Swollen glands?: No Easy bruising?: No  Cardiovascular Leg swelling?: No Chest pain?: No  Respiratory Cough?: No Shortness of breath?: No  Endocrine Excessive thirst?: No  Musculoskeletal Back pain?: Yes Joint pain?: Yes  Neurological Headaches?: No Dizziness?: No  Psychologic Depression?: No Anxiety?: No  Physical Exam: BP 119/62   Pulse 80   Ht 5\' 4"  (1.626 m)   Wt 66.9 kg (147 lb 8 oz)   BMI 25.32 kg/m   Constitutional:  Alert and oriented, No acute distress. HEENT: [place] AT, moist mucus membranes.  Trachea midline, no masses. Cardiovascular: No clubbing, cyanosis, or edema. Respiratory: Normal respiratory effort, no increased work of breathing. GI: Abdomen is soft, nontender, nondistended, no abdominal masses GU: No CVA tenderness. Uncircumcised. Dense phimotic ring Skin: No rashes, bruises or suspicious lesions. Lymph: No cervical or inguinal adenopathy. Neurologic: Grossly intact, no focal deficits, moving all 4 extremities. Psychiatric: Normal mood and affect.  Laboratory Data: No results found for: WBC, HGB, HCT, MCV, PLT  Lab Results  Component Value Date   CREATININE 0.65 02/13/2016    No results found for: PSA  No results found  for: TESTOSTERONE  No results found for: HGBA1C  Urinalysis    Component Value Date/Time   COLORURINE YELLOW (A) 02/13/2016 0027   APPEARANCEUR Clear 05/24/2016 1332   LABSPEC 1.009 02/13/2016 0027   PHURINE 5.0 02/13/2016 0027   GLUCOSEU Negative 05/24/2016 1332   HGBUR 3+ (A) 02/13/2016 0027   BILIRUBINUR Negative 05/24/2016 1332   KETONESUR NEGATIVE 02/13/2016 0027   PROTEINUR  Negative 05/24/2016 1332   PROTEINUR NEGATIVE 02/13/2016 0027   NITRITE Negative 05/24/2016 1332   NITRITE NEGATIVE 02/13/2016 0027   LEUKOCYTESUR Negative 05/24/2016 1332    Pertinent Imaging: none  Assessment & Plan:   1. Phimosis -continue steroid crema -RTC 3 months - Bladder Scan (Post Void Residual) in office   No Follow-up on file.  Nicolette Bang, MD  [facility] [address], [place], [place] [address] [phone]

## 2016-06-22 ENCOUNTER — Telehealth: Payer: Self-pay

## 2016-06-22 ENCOUNTER — Ambulatory Visit: Payer: Medicare Other

## 2016-06-22 VITALS — BP 115/61 | HR 78 | Temp 97.6°F | Wt 149.3 lb

## 2016-06-22 DIAGNOSIS — N471 Phimosis: Secondary | ICD-10-CM

## 2016-06-22 DIAGNOSIS — N390 Urinary tract infection, site not specified: Secondary | ICD-10-CM

## 2016-06-22 LAB — URINALYSIS, COMPLETE
BILIRUBIN UA: NEGATIVE
Glucose, UA: NEGATIVE
Nitrite, UA: NEGATIVE
PH UA: 5.5 (ref 5.0–7.5)
Specific Gravity, UA: >1.03 — ABNORMAL HIGH (ref 1.005–1.030)
Urobilinogen, Ur: 0.2 mg/dL (ref 0.2–1.0)

## 2016-06-22 LAB — MICROSCOPIC EXAMINATION
Epithelial Cells (non renal): NONE SEEN /hpf (ref 0–10)
RBC, UA: >30 /hpf — ABNORMAL HIGH (ref 0–?)
WBC, UA: >30 /hpf — ABNORMAL HIGH (ref 0–?)

## 2016-06-22 MED ORDER — CEPHALEXIN 500 MG PO CAPS: 500.0000 mg | ORAL_CAPSULE | Freq: Four times a day (QID) | ORAL | 0 refills | Status: DC

## 2016-06-22 NOTE — Progress Notes (Signed)
Pt came in today with c/o urinary frequency and urgency, hard to postpone urination, dysuria, leakage of urine, and lower abd pain. Pt has been on abx in the last 30 days due to urethral dilation in the ER. Pt kept a foley for a week and then it was removed. A clean catch urine was obtained for a u/a and cx.    Blood pressure 115/61, pulse 78, temperature 97.6 F (36.4 C), weight 149 lb 4.8 oz (67.7 kg).

## 2016-06-22 NOTE — Telephone Encounter (Signed)
LMOM-medication sent to pharmacy 

## 2016-06-23 ENCOUNTER — Telehealth: Payer: Self-pay | Admitting: Family Medicine

## 2016-06-23 ENCOUNTER — Other Ambulatory Visit: Payer: Self-pay | Admitting: Family Medicine

## 2016-06-23 NOTE — Telephone Encounter (Signed)
Patient notified and will come in after he has finished ABX for Cath Urine Culture

## 2016-06-23 NOTE — Telephone Encounter (Signed)
Patient's son and patient called the office.    Information regarding medication (Keflex) called into the [facility].  Patient's son expressed understanding and stated that he would pick up the prescription today.

## 2016-06-23 NOTE — Telephone Encounter (Signed)
-----   Message from Hollice Espy, MD sent at 06/22/2016  2:58 PM EDT ----- Keflex 500 mg qid x 7 days.  F/u UCx.  This is a non catherized specimen thought tight phimosis therefore likely contaminated but symptomatic.    Hollice Espy, MD

## 2016-06-27 LAB — CULTURE, URINE COMPREHENSIVE

## 2016-06-28 ENCOUNTER — Telehealth: Payer: Self-pay

## 2016-06-28 DIAGNOSIS — N390 Urinary tract infection, site not specified: Secondary | ICD-10-CM

## 2016-06-28 MED ORDER — CIPROFLOXACIN HCL 500 MG PO TABS: 500.0000 mg | ORAL_TABLET | Freq: Two times a day (BID) | ORAL | 0 refills | Status: DC

## 2016-06-28 NOTE — Telephone Encounter (Signed)
Spoke with pt in reference to ucx results. Made aware will need to add cipro to bactrim. Pt stated that he understands but wants son to be contacted. Pt stated he will have son call [facility]. Medication sent to pharmacy.

## 2016-06-28 NOTE — Telephone Encounter (Signed)
-----   Message from Hollice Espy, MD sent at 06/27/2016  4:16 PM EDT ----- Please complete Keflex course already prescribed but also we need to add Cipro 500 mg twice a day 7 days in addition to this he also grew Pseudomonas.  Hollice Espy, MD

## 2016-06-29 ENCOUNTER — Emergency Department
Admission: EM | Admit: 2016-06-29 | Discharge: 2016-06-29 | Payer: Medicare Other | Attending: Emergency Medicine | Admitting: Emergency Medicine

## 2016-06-29 ENCOUNTER — Encounter: Payer: Self-pay | Admitting: Emergency Medicine

## 2016-06-29 ENCOUNTER — Emergency Department: Payer: Medicare Other

## 2016-06-29 DIAGNOSIS — Y9389 Activity, other specified: Secondary | ICD-10-CM | POA: Insufficient documentation

## 2016-06-29 DIAGNOSIS — S1202XA Unstable burst fracture of first cervical vertebra, initial encounter for closed fracture: Secondary | ICD-10-CM

## 2016-06-29 DIAGNOSIS — Y929 Unspecified place or not applicable: Secondary | ICD-10-CM | POA: Diagnosis not present

## 2016-06-29 DIAGNOSIS — E039 Hypothyroidism, unspecified: Secondary | ICD-10-CM | POA: Insufficient documentation

## 2016-06-29 DIAGNOSIS — S1201XA Stable burst fracture of first cervical vertebra, initial encounter for closed fracture: Secondary | ICD-10-CM | POA: Insufficient documentation

## 2016-06-29 DIAGNOSIS — S0083XA Contusion of other part of head, initial encounter: Secondary | ICD-10-CM | POA: Insufficient documentation

## 2016-06-29 DIAGNOSIS — W19XXXA Unspecified fall, initial encounter: Secondary | ICD-10-CM

## 2016-06-29 DIAGNOSIS — Y999 Unspecified external cause status: Secondary | ICD-10-CM | POA: Insufficient documentation

## 2016-06-29 DIAGNOSIS — Z7982 Long term (current) use of aspirin: Secondary | ICD-10-CM | POA: Diagnosis not present

## 2016-06-29 DIAGNOSIS — W01198A Fall on same level from slipping, tripping and stumbling with subsequent striking against other object, initial encounter: Secondary | ICD-10-CM | POA: Diagnosis not present

## 2016-06-29 DIAGNOSIS — Z791 Long term (current) use of non-steroidal anti-inflammatories (NSAID): Secondary | ICD-10-CM | POA: Insufficient documentation

## 2016-06-29 DIAGNOSIS — S0990XA Unspecified injury of head, initial encounter: Secondary | ICD-10-CM | POA: Diagnosis present

## 2016-06-29 MED ORDER — FENTANYL CITRATE (PF) 100 MCG/2ML IJ SOLN
25.0000 ug | Freq: Once | INTRAMUSCULAR | Status: AC
  Administered 2016-06-29: 25 ug via INTRAVENOUS
  Filled 2016-06-29: qty 2

## 2016-06-29 MED ORDER — SODIUM CHLORIDE 0.9 % IV SOLN
Freq: Once | INTRAVENOUS | Status: AC
  Administered 2016-06-29: 23:00:00 via INTRAVENOUS

## 2016-06-29 NOTE — ED Provider Notes (Signed)
[facility] Emergency Department Provider Note  ____________________________________________  Time seen: Approximately 7:27 PM  I have reviewed the triage vital signs and the nursing notes.   HISTORY  Chief Complaint Fall and Neck Pain   HPI Jeffery Alessandro. is a 82 y.o. male history of arthritis, hypothyroidism, and BPH who presents for evaluation of a right forehead hematoma status post mechanical fall. Patient was at [employer] visiting his wife when he tripped and fell hitting his head on the floor. The fall was witnessed and mechanical in nature. Patient denies LOC. Not on anticoagulation. Patient is complaining of pain located in the left lateral neck that has been present since the fall, constant, nonradiating, worse with palpation. Patient denies any other pain at this time.  Past Medical History:  Diagnosis Date  . BPH (benign prostatic hyperplasia)     Patient Active Problem List   Diagnosis Date Noted  . Phimosis 02/22/2016  . Urinary urgency 02/22/2016  . ED (erectile dysfunction) of organic origin 02/22/2016  . Carotid artery thrombosis 02/22/2016  . HLD (hyperlipidemia) 02/22/2016  . Adult hypothyroidism 02/22/2016  . Arthritis, degenerative 02/22/2016  . Lymphoma ([place]) 02/22/2016  . Kidney cysts 02/22/2016  . Prostatitis 02/22/2016  . Bundle branch block, right 02/22/2016    History reviewed. No pertinent surgical history.  Prior to Admission medications   Medication Sig Start Date End Date Taking? Authorizing Provider  alfuzosin (UROXATRAL) 10 MG 24 hr tablet Take 1 tablet (10 mg total) by mouth daily with breakfast. 04/19/16   Cleon Gustin, MD  aspirin 81 MG tablet Take 81 mg by mouth daily.    Historical Provider, MD  cephALEXin (KEFLEX) 500 MG capsule Take 1 capsule (500 mg total) by mouth 4 (four) times daily. 06/22/16   Hollice Espy, MD  ciprofloxacin (CIPRO) 500 MG tablet Take 1 tablet (500 mg total) by mouth  every 12 (twelve) hours. 06/28/16   Hollice Espy, MD  clotrimazole-betamethasone (LOTRISONE) cream Apply 1 application topically 2 (two) times daily. 06/14/16   Cleon Gustin, MD  naproxen sodium (ANAPROX) 220 MG tablet Take by mouth.    Historical Provider, MD  tamsulosin (FLOMAX) 0.4 MG CAPS capsule Take by mouth. 06/01/16   Historical Provider, MD    Allergies Aspirin  History reviewed. No pertinent family history.  Social History Social History  Substance Use Topics  . Smoking status: Never Smoker  . Smokeless tobacco: Never Used  . Alcohol use No    Review of Systems Constitutional: Negative for fever. Eyes: Negative for visual changes. ENT: Negative for facial injury. + neck injury Cardiovascular: Negative for chest injury. Respiratory: Negative for shortness of breath. Negative for chest wall injury. Gastrointestinal: Negative for abdominal pain or injury. Genitourinary: Negative for dysuria. Musculoskeletal: Negative for back injury, negative for arm or leg pain. Skin: Negative for laceration/abrasions. Neurological: + head injury.  ____________________________________________   PHYSICAL EXAM:  VITAL SIGNS: ED Triage Vitals  Enc Vitals Group     BP 06/29/16 1923 (!) 167/78     Pulse Rate 06/29/16 1923 93     Resp 06/29/16 1923 16     Temp --      Temp src --      SpO2 06/29/16 1923 99 %     Weight 06/29/16 1925 160 lb (72.6 kg)     Height 06/29/16 1925 5\' 6"  (1.676 m)     Head Circumference --      [place] --  Pain Score 06/29/16 1925 5     Pain Loc --      Pain Edu? --      Excl. in [place]? --    Full spinal precautions maintained throughout the trauma exam. Constitutional: Alert and oriented. No acute distress. Does not appear intoxicated. HEENT Head: Normocephalic and R forehead hematoma. Face: No facial bony tenderness. Stable midface Ears: Impacted wax bilaterally, unable to visualize TM. No Battle sign Eyes: No eye injury. PERRL. No  raccoon eyes Nose: Nontender. No epistaxis. No rhinorrhea Mouth/Throat: Mucous membranes are moist. No oropharyngeal blood. No dental injury. Airway patent without stridor. Normal voice. Neck: C-collar in place. No midline c-spine tenderness. Left sided paraspinal ttp Cardiovascular: Normal rate, regular rhythm. Normal and symmetric distal pulses are present in all extremities. Pulmonary/Chest: Chest wall is stable and nontender to palpation/compression. Normal respiratory effort. Breath sounds are normal. No crepitus.  Abdominal: Soft, nontender, non distended. Musculoskeletal: Nontender with normal full range of motion in all extremities. No deformities. No thoracic or lumbar midline spinal tenderness. Pelvis is stable. Skin: Skin is warm, dry and intact. No abrasions or contutions. Psychiatric: Speech and behavior are appropriate. Neurological: Normal speech and language. Moves all extremities to command. No gross focal neurologic deficits are appreciated.  Glascow Coma Score: 4 - Opens eyes on own 6 - Follows simple motor commands 5 - Alert and oriented GCS: 15   ____________________________________________   LABS (all labs ordered are listed, but only abnormal results are displayed)  Labs Reviewed - No data to display ____________________________________________  EKG  ED ECG REPORT I, Rudene Re, the attending physician, personally viewed and interpreted this ECG.  Sinus rhythm, rate of 67, right bundle branch block, normal QTC, normal interval, no ST elevations or depressions. Unchanged from 2007 ____________________________________________  RADIOLOGY  CT head and c-spine: 1. Comminuted left lateral mass fracture at C1. 2. 2 mm displaced posterior arch C1 fracture. 3. No additional fractures within the cervical spine. 4. Advanced spondylosis in the cervical spine. 5. Normal CT appearance the brain. 6. Right frontal scalp hematoma without underlying  fracture. ____________________________________________   PROCEDURES  Procedure(s) performed: None Procedures Critical Care performed:  None ____________________________________________   INITIAL IMPRESSION / ASSESSMENT AND PLAN / ED COURSE  82 y.o. male history of arthritis, hypothyroidism, and BPH who presents for evaluation of a right forehead hematoma status post mechanical fall. Patient also complaining of the left paraspinal tenderness of his neck, no midline tenderness. No other injuries. No signs and symptoms of basilar skull fracture. Patient is not anticoagulated. Plan for head CT and CT cervical spine. We'll give fentanyl for the pain.  Clinical Course  Comment By Time  CT concerning for C1 burst fracture. Patient neurologically intact. Images sent to Dr. Lucile Shutters, ortho/ spine at [facility]. Rudene Re, MD 10/18 2042  Spoke with Dr. Lucile Shutters, ortho/ spine at [facility] who will evaluate patient in the ED. Spoke with Dr. Lawrence Marseilles, ED physician who accepted transfer to the ED. Patient remains neuro intact.  Rudene Re, MD 10/18 2231   Patient left ED neurologically intact via EMS en route to [facility].  Pertinent labs & imaging results that were available during my care of the patient were reviewed by me and considered in my medical decision making (see chart for details).    ____________________________________________   FINAL CLINICAL IMPRESSION(S) / ED DIAGNOSES  Final diagnoses:  Closed unstable burst fracture of first cervical vertebra, initial encounter ([place])  Fall, initial encounter  Traumatic hematoma of forehead,  initial encounter      NEW MEDICATIONS STARTED DURING THIS VISIT:  Discharge Medication List as of 06/29/2016 10:42 PM       Note:  This document was prepared using Dragon voice recognition software and may include unintentional dictation errors.    Rudene Re, MD 06/30/16 [phone]

## 2016-06-29 NOTE — ED Notes (Signed)
Report called to [facility] ER to [place], [place]. Report given to EMS crew upon their arrival to ER. Patient transferred from ER stretcher to EMS stretcher without incidence. Prior to and after transfer to EMS stretcher, pt moving all four extremities independently. Pt reporting no difficulty breathing prior to transport.

## 2016-06-29 NOTE — ED Triage Notes (Signed)
Pt arrived via EMS from [employer] where he was visiting his wife. Pt reports that he tripped over his walker and fell and hit his head. Pt has large hematoma to forehead above right eye and is complaining of neck pain. Pt arrives with c-collar in place. Pt denies LOC, back pain, chest pain, SOB, N/V, blurred vision, dizziness.

## 2016-06-30 DIAGNOSIS — W19XXXA Unspecified fall, initial encounter: Secondary | ICD-10-CM | POA: Insufficient documentation

## 2016-06-30 DIAGNOSIS — N400 Enlarged prostate without lower urinary tract symptoms: Secondary | ICD-10-CM | POA: Insufficient documentation

## 2016-06-30 DIAGNOSIS — S12000A Unspecified displaced fracture of first cervical vertebra, initial encounter for closed fracture: Secondary | ICD-10-CM | POA: Insufficient documentation

## 2016-06-30 DIAGNOSIS — Z8673 Personal history of transient ischemic attack (TIA), and cerebral infarction without residual deficits: Secondary | ICD-10-CM | POA: Insufficient documentation

## 2016-09-16 ENCOUNTER — Ambulatory Visit: Payer: Medicare Other

## 2016-10-07 ENCOUNTER — Ambulatory Visit (INDEPENDENT_AMBULATORY_CARE_PROVIDER_SITE_OTHER): Payer: Medicare Other | Admitting: Urology

## 2016-10-07 ENCOUNTER — Encounter: Payer: Self-pay | Admitting: Urology

## 2016-10-07 VITALS — BP 100/65 | HR 96 | Ht 66.0 in | Wt 148.1 lb

## 2016-10-07 DIAGNOSIS — N471 Phimosis: Secondary | ICD-10-CM | POA: Diagnosis not present

## 2016-10-07 DIAGNOSIS — N281 Cyst of kidney, acquired: Secondary | ICD-10-CM | POA: Diagnosis not present

## 2016-10-07 LAB — URINALYSIS, COMPLETE
Bilirubin, UA: NEGATIVE
GLUCOSE, UA: NEGATIVE
NITRITE UA: NEGATIVE
PH UA: 5.5 (ref 5.0–7.5)
Specific Gravity, UA: 1.02 (ref 1.005–1.030)
UUROB: 0.2 mg/dL (ref 0.2–1.0)

## 2016-10-07 LAB — MICROSCOPIC EXAMINATION

## 2016-10-07 LAB — BLADDER SCAN AMB NON-IMAGING: SCAN RESULT: 119

## 2016-10-07 MED ORDER — CLOTRIMAZOLE-BETAMETHASONE 1-0.05 % EX CREA: 1.0000 "application " | TOPICAL_CREAM | CUTANEOUS | 3 refills | Status: DC

## 2016-10-07 NOTE — Progress Notes (Signed)
02/22/2016 10:10 AM   Jeffery Walsh. 09-Jan-1918 [id number]  Referring provider: Idelle Crouch, MD [facility] [place], [place] [address]  No chief complaint on file.   HPI:  1 - Phimosis - long h/o progressive phimosis with some ballooning of foreskin with void. Has beeen manually keeping open x years. On exam with pinpoint opening and dense phimotic ring. Glans not visibla at all due to this and buried penis.   Now on topical betamethasone PRN.  2 - Lower Urinary Tract Symptoms - on uroxatrol 10mg  QD at baseline for mild LUTS, transitioned to tamsulosin BID after neck fracture / fall.  PVR 2017 <60mL.  3 - Non-Complex LEFT Renal Cyst - 8cm left lateral non-complex renal cysts on imaging x several including contrast CT 2009. No enhancing nodules, mass effect, or coarse calcifications.   PMH sig for ankel surgery, CEA. No strong blood thinners. No chest / abd surgery.  His PCP is Georgie Chard MD.  Today " Jeffery Walsh" is seen in f/u above. He had interval psudomonas cystitis from catheterization at hospital that was treated with CX-specific therapy.  PVR today remains acceptable at <22mL.    PMH: Past Medical History  Diagnosis Date  . BPH (benign prostatic hyperplasia)     Surgical History: No past surgical history on file.  Home Medications:    Medication List       This list is accurate as of: 02/22/16 10:10 AM.  Always use your most recent med list.               aspirin 81 MG tablet  Take 81 mg by mouth daily.     tamsulosin 0.4 MG Caps capsule  Commonly known as:  FLOMAX  Take 0.4 mg by mouth.        Allergies: No Known Allergies  Family History: No family history on file.  Social History:  reports that he has never smoked. He has never used smokeless tobacco. He reports that he does not drink alcohol or use illicit drugs.  ROS:    Urological Symptom Review  Patient is experiencing the following symptoms: Frequent  urination   Review of Systems  Gastrointestinal (upper)  : Negative for upper GI symptoms  Gastrointestinal (lower) : Negative for lower GI symptoms  Constitutional : Negative for symptoms  Skin: Negative for skin symptoms  Eyes: Negative for eye symptoms  Ear/Nose/Throat : Negative for Ear/Nose/Throat symptoms  Hematologic/Lymphatic: Negative for Hematologic/Lymphatic symptoms  Cardiovascular : Negative for cardiovascular symptoms  Respiratory : Negative for respiratory symptoms  Endocrine: Negative for endocrine symptoms  Musculoskeletal: Negative for musculoskeletal symptoms  Neurological: Negative for neurological symptoms  Psychologic: Negative for psychiatric symptoms      Physical Exam: There were no vitals taken for this visit.  Constitutional:  Alert and oriented, No acute distress.Frail, eldelry In wheelchiar with C-collar. Son is with him today.  HEENT: [place] AT, moist mucus membranes.  Trachea midline, no masses. Cardiovascular: No clubbing, cyanosis, or edema. Respiratory: Normal respiratory effort, no increased work of breathing. GI: Abdomen is soft, nontender, nondistended, no abdominal masses GU: No CVA tenderness. Severe phimaosis, pinpoint opneing. Dense phimotic ring. Testes down w/o masses / hernias. Buried penis.  Skin: No rashes, bruises or suspicious lesions. Lymph: No cervical or inguinal adenopathy. Neurologic: Grossly intact, no focal deficits, moving all 4 extremities. Psychiatric: Normal mood and affect.  Laboratory Data: No results found for: WBC, HGB, HCT, MCV, PLT  Lab Results  Component Value Date   CREATININE 0.65 02/13/2016      Urinalysis    Component Value Date/Time   COLORURINE YELLOW* 02/13/2016 0027   APPEARANCEUR HAZY* 02/13/2016 0027   LABSPEC 1.009 02/13/2016 0027   PHURINE 5.0 02/13/2016 0027   GLUCOSEU NEGATIVE 02/13/2016 0027   HGBUR 3+* 02/13/2016 0027   BILIRUBINUR NEGATIVE 02/13/2016 0027    KETONESUR NEGATIVE 02/13/2016 0027   PROTEINUR NEGATIVE 02/13/2016 0027   NITRITE NEGATIVE 02/13/2016 0027   LEUKOCYTESUR 1+* 02/13/2016 0027    Pertinent Imaging: All images reference in HPI indepentantly reviwed.   Assessment & Plan:    1 - Phimosis - PT presently not surgical candidate as he is very deconditioned. Rec twice weekly topical steroid.   2 - Lower Urinary Tract Symptoms - continue tamsulosin BID. Would consider finasteirde in addition in future should PVR's become >281mL.   3 - Non-Complex LEFT Renal Cyst - no indication for further evaluation or surveillance, observe.   4 - RTC Urol 1 year wit PVR.     Alexis Frock, [facility] [address], [place], [place] [address] [phone]

## 2017-08-06 IMAGING — CT CT L SPINE W/O CM
3 of 9 series · 13 of 33 positions shown, 16 images · non-contrast
Comparison: None.

CLINICAL DATA: Recent fall on concrete with low back pain, initial
encounter

EXAM:
CT LUMBAR SPINE WITHOUT CONTRAST
TECHNIQUE: Multidetector CT imaging of the lumbar spine was performed without
intravenous contrast administration. Multiplanar CT image
reconstructions were also generated.

[Series 5: l spine soft · axial · 0.33mm/px · z∈[-513,-367]mm · 5 of 103 slices shown, 7 images]
[im 15/103  soft-tissue]
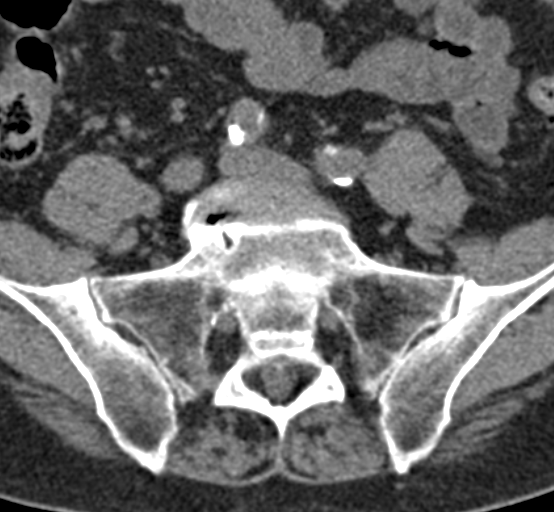
[im 15/103  bone]
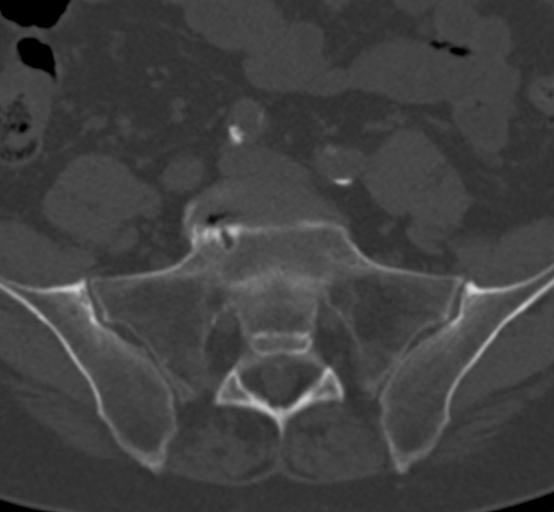
[im 30/103  bone]
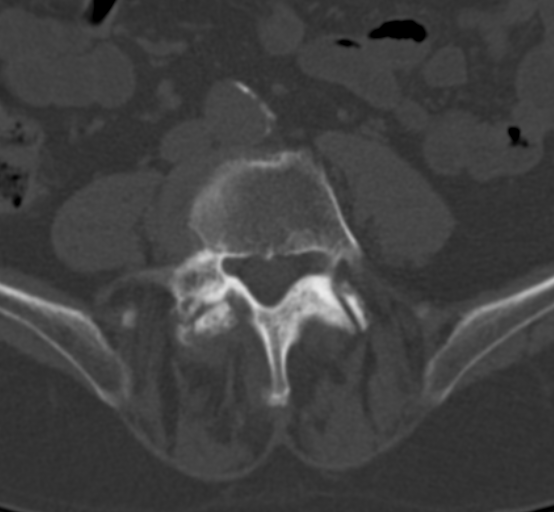
[im 59/103  bone]
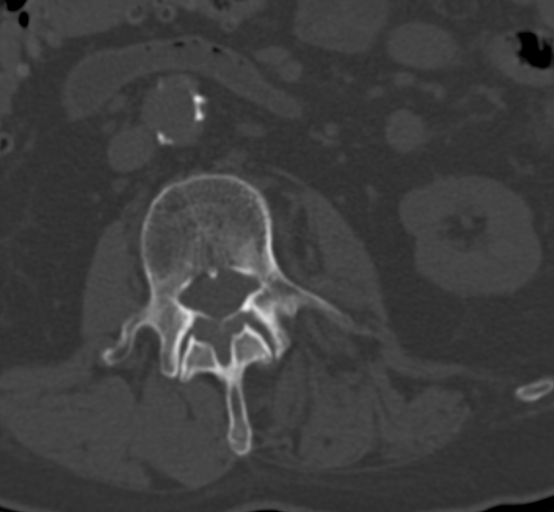
[im 73/103  bone]
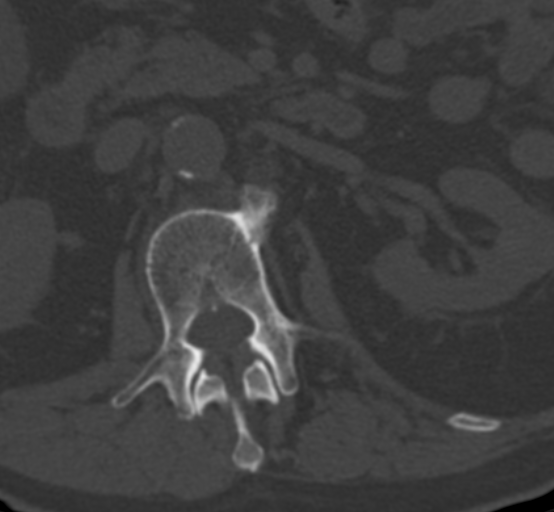
[im 88/103  soft-tissue]
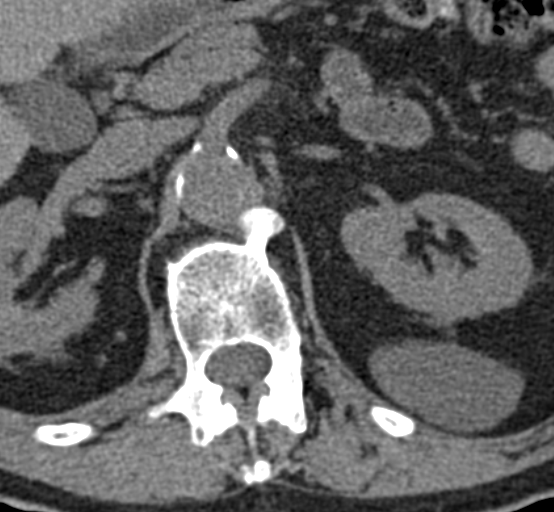
[im 88/103  bone]
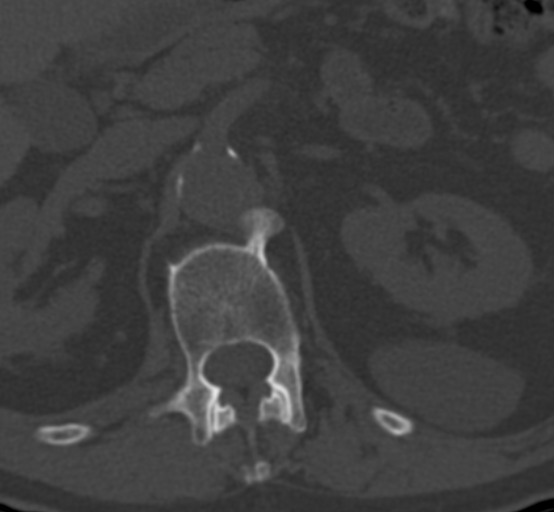

[Series 6: sagittal bone · sagittal · 0.28mm/px · 5 of 79 slices shown, 6 images]
[im 27/79  bone]
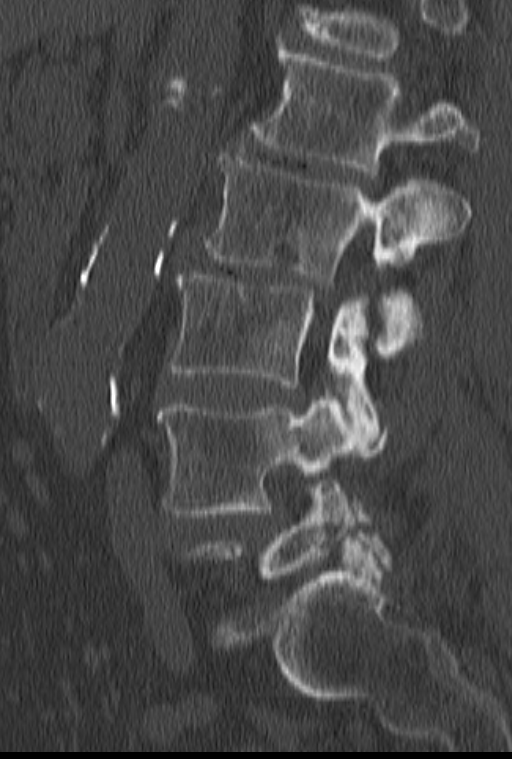
[im 33/79  bone]
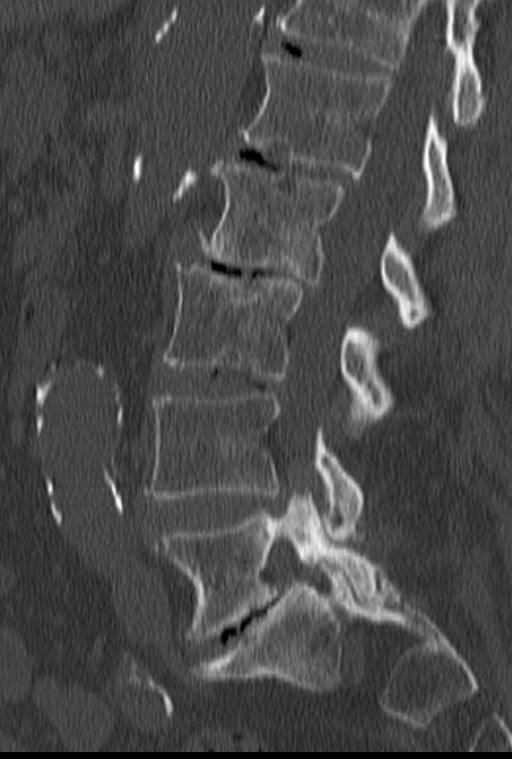
[im 40/79  soft-tissue]
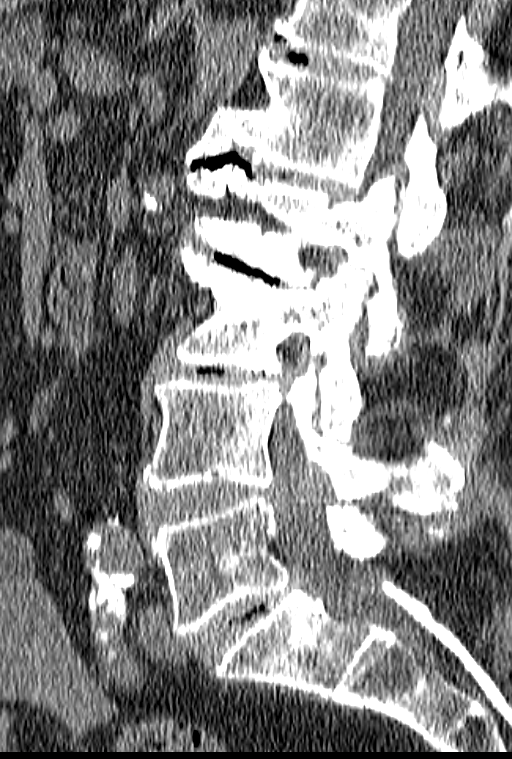
[im 40/79  bone]
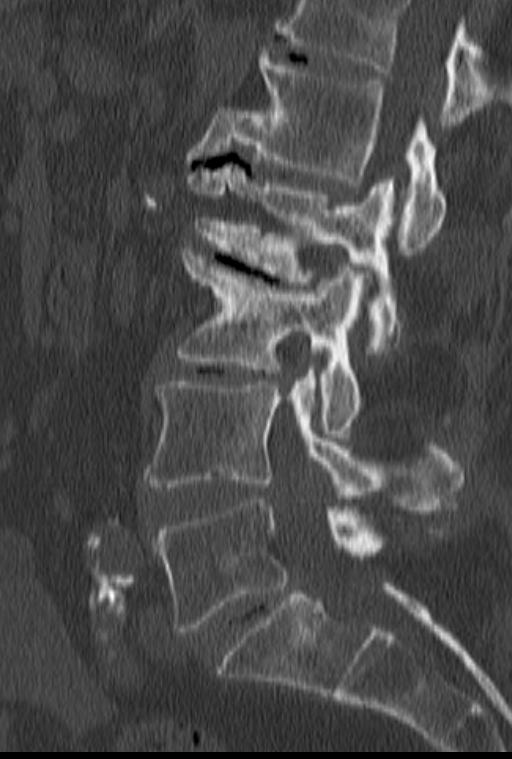
[im 46/79  bone]
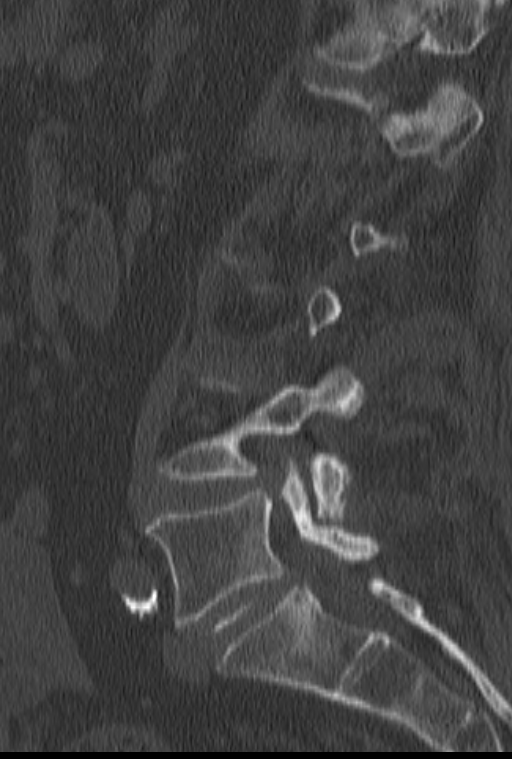
[im 53/79  bone]
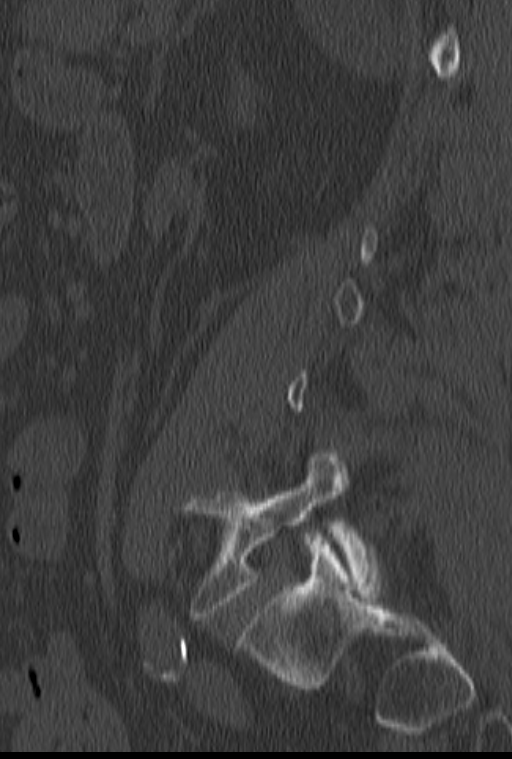

[Series 7: coronal bone · coronal · 0.33mm/px · 3 of 64 slices shown]
[im 13/64  bone]
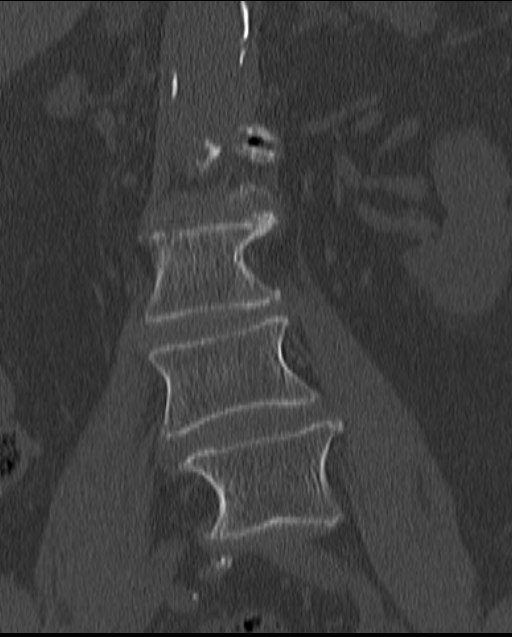
[im 26/64  bone]
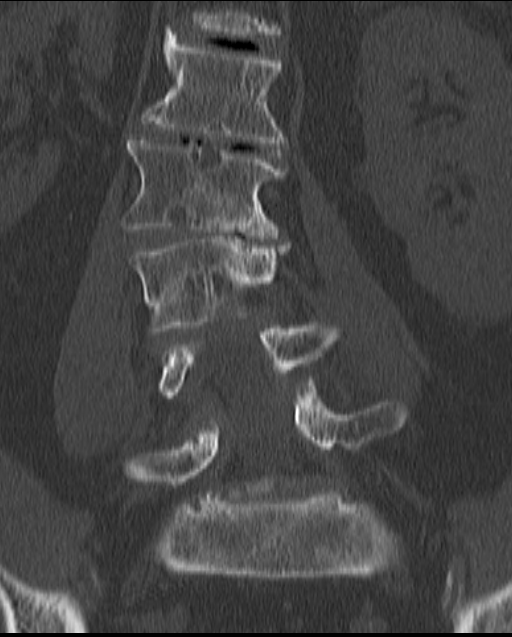
[im 38/64  bone]
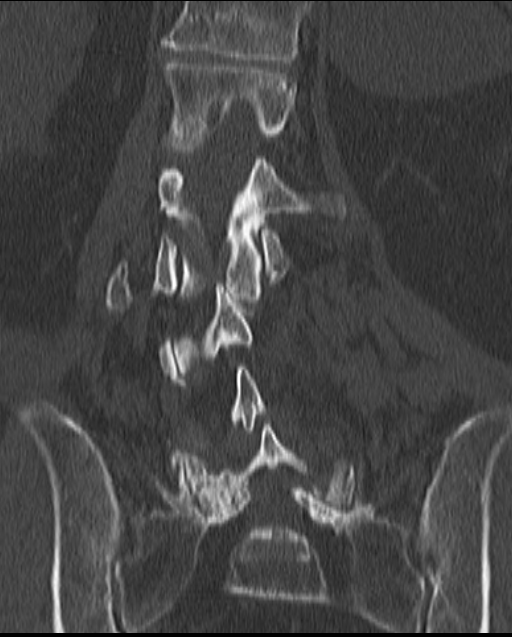

[13 of 33 positions shown; findings below may reference images not displayed]

FINDINGS: Five lumbar type vertebral bodies are well visualized. Vertebral
body height is well maintained. Multilevel disc space narrowing is
noted with vacuum disc phenomena. Some mild retrolisthesis of L2 on
L3 and L1 on L2 is noted of a degenerative nature. Significant facet
hypertrophic changes with ligamentous hypertrophy as well as some
disc bulging is noted at L4-5 with significant central canal
stenosis as well as bilateral neural foraminal narrowing.
IMPRESSION: No acute abnormality is noted. Multilevel degenerative changes are
noted worst at L4-5 with significant central canal stenosis.

## 2017-10-06 ENCOUNTER — Ambulatory Visit: Payer: Medicare Other

## 2017-10-11 ENCOUNTER — Ambulatory Visit (INDEPENDENT_AMBULATORY_CARE_PROVIDER_SITE_OTHER): Payer: Medicare Other | Admitting: Urology

## 2017-10-11 ENCOUNTER — Encounter: Payer: Self-pay | Admitting: Urology

## 2017-10-11 VITALS — BP 136/74 | HR 92 | Ht 66.0 in | Wt 144.8 lb

## 2017-10-11 DIAGNOSIS — R300 Dysuria: Secondary | ICD-10-CM

## 2017-10-11 DIAGNOSIS — N471 Phimosis: Secondary | ICD-10-CM

## 2017-10-11 LAB — URINALYSIS, COMPLETE
BILIRUBIN UA: NEGATIVE
Glucose, UA: NEGATIVE
KETONES UA: NEGATIVE
Nitrite, UA: NEGATIVE
Protein, UA: NEGATIVE
SPEC GRAV UA: 1.02 (ref 1.005–1.030)
UUROB: 0.2 mg/dL (ref 0.2–1.0)
pH, UA: 5.5 (ref 5.0–7.5)

## 2017-10-11 LAB — MICROSCOPIC EXAMINATION
EPITHELIAL CELLS (NON RENAL): NONE SEEN /HPF (ref 0–10)
WBC, UA: NONE SEEN /hpf (ref 0–?)

## 2017-10-11 LAB — BLADDER SCAN AMB NON-IMAGING: Scan Result: 104

## 2017-10-11 NOTE — Progress Notes (Signed)
10/11/2017 7:52 PM   Jeffery Kudo Jr. 24-Jul-1918 [id number]  Referring provider: Idelle Crouch, MD [facility] [place], [place] [address]  Chief Complaint  Patient presents with  . phimosis    HPI: 84 year old male presents for annual follow-up.  He last saw Dr. Tresa Moore in January 2018.  He is followed for severe phimosis and lower urinary tract symptoms.  He has an 8 cm asymptomatic simple left renal cyst.  He has a history of recurrent UTIs.  He states last week he was having bladder discomfort at the end of urination however this has resolved.  He has severe phimosis however has refused previously recommended a dorsal slit.  He remains on tamsulosin 0.4 mg twice daily which he is tolerating without problems.   PMH: Past Medical History:  Diagnosis Date  . BPH (benign prostatic hyperplasia)     Surgical History: History reviewed. No pertinent surgical history.  Home Medications:  Allergies as of 10/11/2017      Reactions   Aspirin Nausea Only   In high doses      Medication List        Accurate as of 10/11/17  7:52 PM. Always use your most recent med list.          aspirin 81 MG tablet Take 81 mg by mouth daily.   clotrimazole-betamethasone cream Commonly known as:  LOTRISONE Apply 1 application topically 2 (two) times a week. To foreskin.   naproxen sodium 220 MG tablet Commonly known as:  ALEVE Take by mouth.   phenazopyridine 100 MG tablet Commonly known as:  PYRIDIUM Take 100 mg by mouth 3 (three) times daily as needed for pain.   tamsulosin 0.4 MG Caps capsule Commonly known as:  FLOMAX Take 0.4 mg by mouth 2 (two) times daily.       Allergies:  Allergies  Allergen Reactions  . Aspirin Nausea Only    In high doses    Family History: History reviewed. No pertinent family history.  Social History:  reports that  has never smoked. he has never used smokeless tobacco. He reports that he does not drink alcohol  or use drugs.  ROS: UROLOGY Frequent Urination?: Yes Hard to postpone urination?: Yes Burning/pain with urination?: Yes Get up at night to urinate?: Yes Leakage of urine?: Yes Urine stream starts and stops?: Yes Trouble starting stream?: No Do you have to strain to urinate?: No Blood in urine?: No Urinary tract infection?: No Sexually transmitted disease?: No Injury to kidneys or bladder?: No Painful intercourse?: No Weak stream?: Yes Erection problems?: No Penile pain?: No  Gastrointestinal Nausea?: No Vomiting?: No Indigestion/heartburn?: Yes Diarrhea?: No Constipation?: No  Constitutional Fever: No Night sweats?: No Weight loss?: No Fatigue?: No  Skin Skin rash/lesions?: Yes Itching?: No  Eyes Blurred vision?: No Double vision?: No  Ears/Nose/Throat Sore throat?: No Sinus problems?: Yes  Hematologic/Lymphatic Swollen glands?: No Easy bruising?: Yes  Cardiovascular Leg swelling?: No Chest pain?: No  Respiratory Cough?: Yes Shortness of breath?: Yes  Endocrine Excessive thirst?: No  Musculoskeletal Back pain?: Yes Joint pain?: Yes  Neurological Headaches?: No Dizziness?: No  Psychologic Depression?: No Anxiety?: Yes  Physical Exam: BP 136/74 (BP Location: Left Arm, Patient Position: Sitting, Cuff Size: Normal)   Pulse 92   Ht 5\' 6"  (1.676 m)   Wt 144 lb 12.8 oz (65.7 kg)   BMI 23.37 kg/m   Constitutional:  Alert and oriented, No acute distress. HEENT: [place] AT, moist  mucus membranes.  Trachea midline, no masses. Cardiovascular: No clubbing, cyanosis, or edema. Respiratory: Normal respiratory effort, no increased work of breathing. GI: Abdomen is soft, nontender, nondistended, no abdominal masses GU: No CVA tenderness.  Penis uncircumcised with severe phimosis and a dense fibrotic ring.  Unable to visualize the urethral meatus. Skin: No rashes, bruises or suspicious lesions. Lymph: No cervical or inguinal adenopathy. Neurologic:  Grossly intact, no focal deficits, moving all 4 extremities. Psychiatric: Normal mood and affect.  Laboratory Data: No results found for: WBC, HGB, HCT, MCV, PLT  Lab Results  Component Value Date   CREATININE 0.65 02/13/2016    Urinalysis Lab Results  Component Value Date   SPECGRAV 1.020 10/11/2017   PHUR 5.5 10/11/2017   COLORU Yellow 10/11/2017   APPEARANCEUR Clear 10/11/2017   LEUKOCYTESUR Trace (A) 10/11/2017   PROTEINUR Negative 10/11/2017   GLUCOSEU Negative 10/11/2017   KETONESU Negative 10/11/2017   RBCU 1+ (A) 10/11/2017   BILIRUBINUR Negative 10/11/2017   UUROB 0.2 10/11/2017   NITRITE Negative 10/11/2017    Lab Results  Component Value Date   LABMICR See below: 10/11/2017   WBCUA None seen 10/11/2017   RBCUA 3-10 (A) 10/11/2017   LABEPIT None seen 10/11/2017   MUCUS Present (A) 10/11/2017   BACTERIA Moderate (A) 10/11/2017    Assessment & Plan:   1. Phimosis I spoke with Jeffery Walsh and his son and recommended a dorsal slit however he has refused.  The procedure was discussed and he was instructed to call back if he changes his mind.  He will otherwise continue annual follow-up.   2.  Lower urinary tract symptoms PVR by bladder scan today was stable at 104 mL.  Urinalysis had 3-10 RBCs however he voided through his phimotic foreskin.    Abbie Sons, [facility] [address], [place], [place] [address] [phone]

## 2018-10-11 ENCOUNTER — Ambulatory Visit (INDEPENDENT_AMBULATORY_CARE_PROVIDER_SITE_OTHER): Payer: Medicare Other | Admitting: Urology

## 2018-10-11 ENCOUNTER — Encounter: Payer: Self-pay | Admitting: Urology

## 2018-10-11 VITALS — BP 127/74 | HR 86

## 2018-10-11 DIAGNOSIS — N471 Phimosis: Secondary | ICD-10-CM | POA: Diagnosis not present

## 2018-10-11 DIAGNOSIS — N401 Enlarged prostate with lower urinary tract symptoms: Secondary | ICD-10-CM

## 2018-10-11 NOTE — Patient Instructions (Signed)

## 2018-10-11 NOTE — Progress Notes (Signed)
10/11/2018  2:52 PM   Jeffery Walsh. 1917-09-24 [id number]  Referring provider: Idelle Crouch, MD [facility] [place], [place] [address]  Chief Complaint  Patient presents with  . Phimosis F/U    Urologic History: 1. Phimosis, severe  - Refused dorsal slit   2. Simple Left Renal Cyst  - asymptomatic  3. History of rUTIs  4. Lower Urinary Tract Symptoms  - Currently on tamsulosin 0.4 mg bid, which he tolerates without problems  HPI: Jeffery Walsh. is a 85 yo M who presents today for an annual f/u  - Jeffery Walsh  previously refused dorsal slit   - Son reports that patient's foreskin was opened up a couple times as patient tends to manually stretches his penis  - Patient uses urinal at night and goes through 2 urinals a night.   PMH: Past Medical History:  Diagnosis Date  . BPH (benign prostatic hyperplasia)    Surgical History: No past surgical history on file.  Home Medications:  Allergies as of 10/11/2018      Reactions   Aspirin Nausea Only   In high doses      Medication List       Accurate as of October 11, 2018  2:52 PM. Always use your most recent med list.        aspirin 81 MG tablet Take 81 mg by mouth daily.   clotrimazole-betamethasone cream Commonly known as:  LOTRISONE Apply 1 application topically 2 (two) times a week. To foreskin.   naproxen sodium 220 MG tablet Commonly known as:  ALEVE Take by mouth.   phenazopyridine 100 MG tablet Commonly known as:  PYRIDIUM Take 100 mg by mouth 3 (three) times daily as needed for pain.   tamsulosin 0.4 MG Caps capsule Commonly known as:  FLOMAX Take 0.4 mg by mouth 2 (two) times daily.       Allergies:  Allergies  Allergen Reactions  . Aspirin Nausea Only    In high doses    Family History: No family history on file.  Social History:  reports that he has never smoked. He has never used smokeless tobacco. He reports that he does not drink alcohol or  use drugs.  ROS: UROLOGY Frequent Urination?: Yes Hard to postpone urination?: Yes Burning/pain with urination?: Yes Get up at night to urinate?: Yes Leakage of urine?: Yes Urine stream starts and stops?: Yes Trouble starting stream?: Yes Do you have to strain to urinate?: No Blood in urine?: No Urinary tract infection?: No Sexually transmitted disease?: No Injury to kidneys or bladder?: No Painful intercourse?: No Weak stream?: Yes Erection problems?: No Penile pain?: No  Gastrointestinal Nausea?: No Vomiting?: No Indigestion/heartburn?: No Diarrhea?: No Constipation?: Yes  Constitutional Fever: No Night sweats?: Yes Weight loss?: No Fatigue?: Yes  Skin Skin rash/lesions?: Yes Itching?: Yes  Eyes Blurred vision?: No Double vision?: No  Ears/Nose/Throat Sore throat?: No Sinus problems?: No  Hematologic/Lymphatic Swollen glands?: No Easy bruising?: Yes  Cardiovascular Leg swelling?: No Chest pain?: No  Respiratory Cough?: Yes Shortness of breath?: No  Endocrine Excessive thirst?: No  Musculoskeletal Back pain?: Yes Joint pain?: Yes  Neurological Headaches?: No Dizziness?: No  Psychologic Depression?: No Anxiety?: No  Physical Exam: BP 127/74 (BP Location: Left Arm, Patient Position: Sitting, Cuff Size: Normal)   Pulse 86   Constitutional:  Alert and oriented, No acute distress. Respiratory: Normal respiratory effort, no increased work of breathing. GU: No CVA tenderness. Penis  uncircumcised with severe phimosis and a dense fibrotic ring. Unable to visualize the urethral meatus. Skin: No rashes, bruises or suspicious lesions. Neurologic: Grossly intact, no focal deficits, moving all 4 extremities. Psychiatric: Normal mood and affect.  Assessment & Plan:    1. Phimosis   - Not much has changed on exam today  - At last visit, I recommended a dorsal slit. The patient remains uninterested in this intervention option.   2. Lower  urinary tract symptoms  - PVR today was 104 mL  - Discussed fall concerns associated with increasing tamsulosin dosage.  - Discussed benefits of the additional implementation of finasteride on the patients symptoms. Patient is unsure about adding finasteride at this time  Return in about 1 year (around 10/12/2019) for 1 year follow up.  Abbie Sons, MD [facility] [address] [place], [place] [address] [phone]  I, [phone] Renne Crigler , am acting as a scribe for Abbie Sons, MD  I, Abbie Sons, MD, have reviewed all documentation for this visit. The documentation on 10/12/18 for the exam, diagnosis, procedures, and orders are all accurate and complete.

## 2018-10-11 NOTE — Progress Notes (Signed)
Error

## 2018-10-12 ENCOUNTER — Encounter: Payer: Self-pay | Admitting: Urology

## 2019-07-13 ENCOUNTER — Other Ambulatory Visit: Payer: Self-pay

## 2019-07-13 ENCOUNTER — Inpatient Hospital Stay (HOSPITAL_COMMUNITY)
Admission: AD | Admit: 2019-07-13 | Discharge: 2019-08-13 | DRG: 177 | Disposition: E | Payer: Medicare Other | Source: Other Acute Inpatient Hospital | Attending: Family Medicine | Admitting: Family Medicine

## 2019-07-13 ENCOUNTER — Emergency Department
Admission: EM | Admit: 2019-07-13 | Discharge: 2019-07-13 | Disposition: A | Discharge status: Other Acute Inpatient Hospital | Payer: Medicare Other | Attending: Emergency Medicine | Admitting: Emergency Medicine

## 2019-07-13 ENCOUNTER — Encounter: Payer: Self-pay | Admitting: Emergency Medicine

## 2019-07-13 ENCOUNTER — Emergency Department: Payer: Medicare Other

## 2019-07-13 DIAGNOSIS — C8590 Non-Hodgkin lymphoma, unspecified, unspecified site: Secondary | ICD-10-CM | POA: Diagnosis present

## 2019-07-13 DIAGNOSIS — E785 Hyperlipidemia, unspecified: Secondary | ICD-10-CM | POA: Diagnosis present

## 2019-07-13 DIAGNOSIS — I4510 Unspecified right bundle-branch block: Secondary | ICD-10-CM | POA: Diagnosis not present

## 2019-07-13 DIAGNOSIS — H9190 Unspecified hearing loss, unspecified ear: Secondary | ICD-10-CM | POA: Diagnosis present

## 2019-07-13 DIAGNOSIS — J189 Pneumonia, unspecified organism: Secondary | ICD-10-CM | POA: Diagnosis not present

## 2019-07-13 DIAGNOSIS — J9601 Acute respiratory failure with hypoxia: Secondary | ICD-10-CM | POA: Diagnosis not present

## 2019-07-13 DIAGNOSIS — J1289 Other viral pneumonia: Secondary | ICD-10-CM | POA: Diagnosis present

## 2019-07-13 DIAGNOSIS — Z7982 Long term (current) use of aspirin: Secondary | ICD-10-CM | POA: Insufficient documentation

## 2019-07-13 DIAGNOSIS — A419 Sepsis, unspecified organism: Secondary | ICD-10-CM | POA: Insufficient documentation

## 2019-07-13 DIAGNOSIS — Z79899 Other long term (current) drug therapy: Secondary | ICD-10-CM | POA: Diagnosis not present

## 2019-07-13 DIAGNOSIS — J9621 Acute and chronic respiratory failure with hypoxia: Secondary | ICD-10-CM | POA: Diagnosis present

## 2019-07-13 DIAGNOSIS — Z886 Allergy status to analgesic agent status: Secondary | ICD-10-CM

## 2019-07-13 DIAGNOSIS — R0902 Hypoxemia: Secondary | ICD-10-CM

## 2019-07-13 DIAGNOSIS — N400 Enlarged prostate without lower urinary tract symptoms: Secondary | ICD-10-CM | POA: Diagnosis present

## 2019-07-13 DIAGNOSIS — I454 Nonspecific intraventricular block: Secondary | ICD-10-CM | POA: Diagnosis present

## 2019-07-13 DIAGNOSIS — Z66 Do not resuscitate: Secondary | ICD-10-CM | POA: Diagnosis present

## 2019-07-13 DIAGNOSIS — E039 Hypothyroidism, unspecified: Secondary | ICD-10-CM | POA: Diagnosis not present

## 2019-07-13 DIAGNOSIS — U071 COVID-19: Secondary | ICD-10-CM | POA: Diagnosis present

## 2019-07-13 DIAGNOSIS — R0602 Shortness of breath: Secondary | ICD-10-CM | POA: Diagnosis present

## 2019-07-13 DIAGNOSIS — Z8673 Personal history of transient ischemic attack (TIA), and cerebral infarction without residual deficits: Secondary | ICD-10-CM | POA: Insufficient documentation

## 2019-07-13 DIAGNOSIS — N401 Enlarged prostate with lower urinary tract symptoms: Secondary | ICD-10-CM | POA: Diagnosis not present

## 2019-07-13 DIAGNOSIS — Z515 Encounter for palliative care: Secondary | ICD-10-CM | POA: Diagnosis present

## 2019-07-13 DIAGNOSIS — K921 Melena: Secondary | ICD-10-CM | POA: Diagnosis present

## 2019-07-13 LAB — CBC WITH DIFFERENTIAL/PLATELET
Abs Immature Granulocytes: 0.02 10*3/uL (ref 0.00–0.07)
Abs Immature Granulocytes: 0.14 10*3/uL — ABNORMAL HIGH (ref 0.00–0.07)
Basophils Absolute: 0 10*3/uL (ref 0.0–0.1)
Basophils Absolute: 0 10*3/uL (ref 0.0–0.1)
Basophils Relative: 0 %
Basophils Relative: 0 %
Eosinophils Absolute: 0 10*3/uL (ref 0.0–0.5)
Eosinophils Absolute: 0 10*3/uL (ref 0.0–0.5)
Eosinophils Relative: 0 %
Eosinophils Relative: 0 %
HCT: 38.9 % — ABNORMAL LOW (ref 39.0–52.0)
HCT: 43.9 % (ref 39.0–52.0)
Hemoglobin: 13 g/dL (ref 13.0–17.0)
Hemoglobin: 14.2 g/dL (ref 13.0–17.0)
Immature Granulocytes: 0 %
Immature Granulocytes: 1 %
Lymphocytes Relative: 3 %
Lymphocytes Relative: 4 %
Lymphs Abs: 0.2 10*3/uL — ABNORMAL LOW (ref 0.7–4.0)
Lymphs Abs: 0.7 10*3/uL (ref 0.7–4.0)
MCH: 31 pg (ref 26.0–34.0)
MCH: 31.7 pg (ref 26.0–34.0)
MCHC: 33.4 g/dL (ref 30.0–36.0)
MCHC: 32.3 g/dL (ref 30.0–36.0)
MCV: 92.8 fL (ref 80.0–100.0)
MCV: 98 fL (ref 80.0–100.0)
Monocytes Absolute: 0.4 10*3/uL (ref 0.1–1.0)
Monocytes Absolute: 0.4 10*3/uL (ref 0.1–1.0)
Monocytes Relative: 4 %
Monocytes Relative: 3 %
Neutro Abs: 7.5 10*3/uL (ref 1.7–7.7)
Neutro Abs: 14.5 10*3/uL — ABNORMAL HIGH (ref 1.7–7.7)
Neutrophils Relative %: 93 %
Neutrophils Relative %: 92 %
Platelets: 131 10*3/uL — ABNORMAL LOW (ref 150–400)
Platelets: 194 10*3/uL (ref 150–400)
RBC: 4.19 MIL/uL — ABNORMAL LOW (ref 4.22–5.81)
RBC: 4.48 MIL/uL (ref 4.22–5.81)
RDW: 14.2 % (ref 11.5–15.5)
RDW: 14.6 % (ref 11.5–15.5)
WBC: 8.1 10*3/uL (ref 4.0–10.5)
WBC: 15.7 10*3/uL — ABNORMAL HIGH (ref 4.0–10.5)
nRBC: 0 % (ref 0.0–0.2)
nRBC: 0 % (ref 0.0–0.2)

## 2019-07-13 LAB — LACTIC ACID, PLASMA
Lactic Acid, Venous: 2.7 mmol/L (ref 0.5–1.9)
Lactic Acid, Venous: 2.2 mmol/L (ref 0.5–1.9)

## 2019-07-13 LAB — COMPREHENSIVE METABOLIC PANEL
ALT: 37 U/L (ref 0–44)
ALT: 37 U/L (ref 0–44)
AST: 37 U/L (ref 15–41)
AST: 39 U/L (ref 15–41)
Albumin: 2.9 g/dL — ABNORMAL LOW (ref 3.5–5.0)
Albumin: 2.9 g/dL — ABNORMAL LOW (ref 3.5–5.0)
Alkaline Phosphatase: 44 U/L (ref 38–126)
Alkaline Phosphatase: 51 U/L (ref 38–126)
Anion gap: 11 (ref 5–15)
Anion gap: 16 — ABNORMAL HIGH (ref 5–15)
BUN: 43 mg/dL — ABNORMAL HIGH (ref 8–23)
BUN: 46 mg/dL — ABNORMAL HIGH (ref 8–23)
CO2: 22 mmol/L (ref 22–32)
CO2: 17 mmol/L — ABNORMAL LOW (ref 22–32)
Calcium: 7.9 mg/dL — ABNORMAL LOW (ref 8.9–10.3)
Calcium: 7.7 mg/dL — ABNORMAL LOW (ref 8.9–10.3)
Chloride: 104 mmol/L (ref 98–111)
Chloride: 109 mmol/L (ref 98–111)
Creatinine, Ser: 1.01 mg/dL (ref 0.61–1.24)
Creatinine, Ser: 1.06 mg/dL (ref 0.61–1.24)
GFR calc Af Amer: >60 mL/min (ref >60)
GFR calc Af Amer: >60 mL/min (ref >60)
GFR calc non Af Amer: >60 mL/min (ref >60)
GFR calc non Af Amer: 58 mL/min — ABNORMAL LOW (ref >60)
Glucose, Bld: 157 mg/dL — ABNORMAL HIGH (ref 70–99)
Glucose, Bld: 164 mg/dL — ABNORMAL HIGH (ref 70–99)
Potassium: 4 mmol/L (ref 3.5–5.1)
Potassium: 4.4 mmol/L (ref 3.5–5.1)
Sodium: 137 mmol/L (ref 135–145)
Sodium: 142 mmol/L (ref 135–145)
Total Bilirubin: 0.7 mg/dL (ref 0.3–1.2)
Total Bilirubin: 0.7 mg/dL (ref 0.3–1.2)
Total Protein: 5.5 g/dL — ABNORMAL LOW (ref 6.5–8.1)
Total Protein: 6 g/dL — ABNORMAL LOW (ref 6.5–8.1)

## 2019-07-13 LAB — URINALYSIS, COMPLETE (UACMP) WITH MICROSCOPIC
Bilirubin Urine: NEGATIVE
Glucose, UA: NEGATIVE mg/dL
Hgb urine dipstick: NEGATIVE
Ketones, ur: NEGATIVE mg/dL
Nitrite: NEGATIVE
Protein, ur: 30 mg/dL — AB
Specific Gravity, Urine: 1.026 (ref 1.005–1.030)
pH: 5 (ref 5.0–8.0)

## 2019-07-13 LAB — SARS CORONAVIRUS 2 BY RT PCR (HOSPITAL ORDER, PERFORMED IN ~~LOC~~ HOSPITAL LAB): SARS Coronavirus 2: POSITIVE — AB

## 2019-07-13 LAB — C-REACTIVE PROTEIN
CRP: 7.3 mg/dL — ABNORMAL HIGH (ref <1.0)
CRP: 9.9 mg/dL — ABNORMAL HIGH (ref <1.0)

## 2019-07-13 LAB — FIBRIN DERIVATIVES D-DIMER (ARMC ONLY): Fibrin derivatives D-dimer (ARMC): 1327.72 ng/mL (FEU) — ABNORMAL HIGH (ref 0.00–499.00)

## 2019-07-13 LAB — D-DIMER, QUANTITATIVE: D-Dimer, Quant: 5.05 ug/mL-FEU — ABNORMAL HIGH (ref 0.00–0.50)

## 2019-07-13 LAB — TROPONIN I (HIGH SENSITIVITY)
Troponin I (High Sensitivity): 45 ng/L — ABNORMAL HIGH (ref <18)
Troponin I (High Sensitivity): 46 ng/L — ABNORMAL HIGH (ref <18)

## 2019-07-13 MED ORDER — SODIUM CHLORIDE 0.9 % IV SOLN
100.0000 mg | INTRAVENOUS | Status: DC
  Administered 2019-07-14 – 2019-07-16 (×3): 100 mg via INTRAVENOUS
  Filled 2019-07-13 (×5): qty 20

## 2019-07-13 MED ORDER — ONDANSETRON HCL 4 MG/2ML IJ SOLN: 4.0000 mg | Freq: Four times a day (QID) | INTRAMUSCULAR | Status: DC | PRN

## 2019-07-13 MED ORDER — ENOXAPARIN SODIUM 40 MG/0.4ML ~~LOC~~ SOLN
40.0000 mg | SUBCUTANEOUS | Status: DC
  Administered 2019-07-13: 40 mg via SUBCUTANEOUS
  Filled 2019-07-13: qty 0.4

## 2019-07-13 MED ORDER — ZINC SULFATE 220 (50 ZN) MG PO CAPS
220.0000 mg | ORAL_CAPSULE | Freq: Every day | ORAL | Status: DC
  Administered 2019-07-13: 220 mg via ORAL
  Filled 2019-07-13: qty 1

## 2019-07-13 MED ORDER — TAMSULOSIN HCL 0.4 MG PO CAPS
0.4000 mg | ORAL_CAPSULE | Freq: Two times a day (BID) | ORAL | Status: DC
  Administered 2019-07-13: 0.4 mg via ORAL
  Filled 2019-07-13: qty 1

## 2019-07-13 MED ORDER — HYDROCOD POLST-CPM POLST ER 10-8 MG/5ML PO SUER: 5.0000 mL | Freq: Two times a day (BID) | ORAL | Status: DC | PRN

## 2019-07-13 MED ORDER — SODIUM CHLORIDE 0.9 % IV BOLUS (SEPSIS): 1000.0000 mL | Freq: Once | INTRAVENOUS | Status: DC

## 2019-07-13 MED ORDER — VANCOMYCIN HCL IN DEXTROSE 1-5 GM/200ML-% IV SOLN: 1000.0000 mg | Freq: Once | INTRAVENOUS | Status: DC

## 2019-07-13 MED ORDER — VITAMIN C 500 MG PO TABS
500.0000 mg | ORAL_TABLET | Freq: Every day | ORAL | Status: DC
  Administered 2019-07-13: 22:00:00 500 mg via ORAL
  Filled 2019-07-13: qty 1

## 2019-07-13 MED ORDER — SODIUM CHLORIDE 0.9 % IV SOLN
2.0000 g | Freq: Once | INTRAVENOUS | Status: AC
  Administered 2019-07-13: 2 g via INTRAVENOUS
  Filled 2019-07-13: qty 2

## 2019-07-13 MED ORDER — ACETAMINOPHEN 325 MG PO TABS
650.0000 mg | ORAL_TABLET | Freq: Four times a day (QID) | ORAL | Status: DC | PRN
  Administered 2019-07-14: 650 mg via ORAL
  Filled 2019-07-13: qty 2

## 2019-07-13 MED ORDER — ASPIRIN 81 MG PO TBEC
81.0000 mg | DELAYED_RELEASE_TABLET | Freq: Every day | ORAL | Status: DC
  Administered 2019-07-13: 81 mg via ORAL
  Filled 2019-07-13 (×2): qty 1

## 2019-07-13 MED ORDER — SODIUM CHLORIDE 0.9 % IV BOLUS (SEPSIS)
1000.0000 mL | Freq: Once | INTRAVENOUS | Status: AC
  Administered 2019-07-13: 1000 mL via INTRAVENOUS

## 2019-07-13 MED ORDER — SODIUM CHLORIDE 0.9 % IV SOLN
200.0000 mg | Freq: Once | INTRAVENOUS | Status: DC
  Filled 2019-07-13: qty 40

## 2019-07-13 MED ORDER — SODIUM CHLORIDE 0.9 % IV BOLUS (SEPSIS)
1000.0000 mL | Freq: Once | INTRAVENOUS | Status: AC
  Administered 2019-07-13: 08:00:00 1000 mL via INTRAVENOUS

## 2019-07-13 MED ORDER — VANCOMYCIN HCL 1.5 G IV SOLR
1500.0000 mg | Freq: Once | INTRAVENOUS | Status: AC
  Administered 2019-07-13: 1500 mg via INTRAVENOUS
  Filled 2019-07-13: qty 1500

## 2019-07-13 MED ORDER — GUAIFENESIN-DM 100-10 MG/5ML PO SYRP: 10.0000 mL | ORAL_SOLUTION | ORAL | Status: DC | PRN

## 2019-07-13 MED ORDER — ONDANSETRON HCL 4 MG PO TABS: 4.0000 mg | ORAL_TABLET | Freq: Four times a day (QID) | ORAL | Status: DC | PRN

## 2019-07-13 MED ORDER — SODIUM CHLORIDE 0.9 % IV SOLN
200.0000 mg | Freq: Once | INTRAVENOUS | Status: AC
  Administered 2019-07-13: 22:00:00 200 mg via INTRAVENOUS
  Filled 2019-07-13: qty 40

## 2019-07-13 MED ORDER — METHYLPREDNISOLONE SODIUM SUCC 40 MG IJ SOLR
40.0000 mg | Freq: Two times a day (BID) | INTRAMUSCULAR | Status: DC
  Administered 2019-07-13: 22:00:00 40 mg via INTRAVENOUS
  Filled 2019-07-13: qty 1

## 2019-07-13 NOTE — ED Notes (Signed)
[name] daughter - [phone]

## 2019-07-13 NOTE — Progress Notes (Signed)
Patient received from [facility] via transport. Patient covered in urine and feces. Complete bath given, MD @bedside  to assess. VS documented, pt placed on nonrebreather mask 15L. [place] per MD order. Condom cath applied for incontinence. Patient's daughter Rod Holler called to update on pt's location. DNR/DNI status verified w daughter by MD. Patient adjusted in bed for comfort, bed alarm on.

## 2019-07-13 NOTE — ED Notes (Signed)
emtala reviewed by this RN 

## 2019-07-13 NOTE — Progress Notes (Addendum)
Checking Mg and Lactate, rest of electrolytes at 1800 looked okay except bicarb slightly low with AG 16.  If lactate elevated, may put him on a very small amount of IVF, but patient already on 15L per report.  See also Dr. Arman Filter note from earlier this evening under "goals of care".

## 2019-07-13 NOTE — ED Notes (Signed)
Pt's changed over to high flow oxygen. Keeps taking off the mask, and on just nasal canula his spo2 is in low to mid 80's. Pt has no complaints. Pulse ox changed to toe as he keeps moving his hands. When we get a good reading his spo2 is 91%

## 2019-07-13 NOTE — ED Provider Notes (Signed)
Patient here with COVID related hypoxia. Admitted to [facility]. Pt is DNR/DNI. Of note, patient has been activated as a code sepsis with 3500 cc of fluid ordered.  Lactic acid is minimally elevated, but I suspect this is due to increased work of breathing.  He has no hypotension or symptoms to suggest hypoperfusion and given lactate less than 4 with stable vitals, as well as his Covid diagnosis, as well as his age and DNR status, will be cautious with fluids at this time.  Fluid boluses held and will reassess as needed.   Duffy Bruce, MD 06/25/2019 [id number]

## 2019-07-13 NOTE — Progress Notes (Signed)
SNF resident who was seen at [facility] for [place] before transferring here. He is on 15L HFNC and a positive CXR.   ALT wnl Scr ~1  Remdesivir 200mg  IV x1 then 100mg  IV q24 x4 F/u ALT  Onnie Boer, PharmD, BCIDP, AAHIVP, CPP Infectious Disease Pharmacist 06/21/2019 6:19 PM

## 2019-07-13 NOTE — Progress Notes (Signed)
PHARMACY -  BRIEF ANTIBIOTIC NOTE   Pharmacy has received consult(s) for Vancomycin and Cefepime from an ED provider.  The patient's profile has been reviewed for ht/wt/allergies/indication/available labs.    One time order(s) placed for Vancomycin 1500mg  and Cefepime 2000mg   Further antibiotics/pharmacy consults should be ordered by admitting physician if indicated.                       Thank you, Ena Dawley 07/10/2019  6:31 AM

## 2019-07-13 NOTE — ED Notes (Signed)
Fluids stopped at this time per MD request. Pt received 877ml from one liter, and 600 from the second liter of fluids previously started.

## 2019-07-13 NOTE — Progress Notes (Signed)
CODE SEPSIS - PHARMACY COMMUNICATION  **Broad Spectrum Antibiotics should be administered within 1 hour of Sepsis diagnosis**  Time Code Sepsis Called/Page Received: 0615  Antibiotics Ordered: Vancomycin and Cefepime  Time of 1st antibiotic administration: ~ 0720 per report  Additional action taken by pharmacy: called ED, spoke to [facility], held up giving ABX as she was intubating another patient  If necessary, Name of Provider/Nurse Contacted: Gloris Ham, Elayne Snare ,PharmD Clinical Pharmacist  06/28/2019  6:30 AM

## 2019-07-13 NOTE — ED Notes (Signed)
RT at bedside.

## 2019-07-13 NOTE — ED Notes (Signed)
Pt sleeping soundly. Vss. Awaiting a bed at [facility].

## 2019-07-13 NOTE — ED Notes (Signed)
Report given to [facility] 

## 2019-07-13 NOTE — ED Triage Notes (Signed)
Pt arrives via [facility] with c/o SOB from [facility]. Per [facility] pt sent to this facility due to "not able to be comfortable". Pt stating that he is in no pain at this time but would like a pillow.

## 2019-07-13 NOTE — H&P (Signed)
History and Physical    Jeffery Walsh. [id number] DOB: May 31, 1918 DOA: (Not on file)  I have briefly reviewed the patient's prior medical records in [facility]  PCP: Idelle Crouch, MD  Patient coming from: [place] ED, SNF resident   Chief Complaint: shortness of breath   HPI: Jeffery Walsh. is a 85 y.o. male with medical history significant of BPH, prior CVA who comes to the ER from his SNF ([facility]) with complaints of shortness of breath. He has a degree from chronic hypoxia on 2L [place] and reportedly he has had low oxygen levels at the SNF requiring increasing his O2 to 4L but still sats in the 80s thus he was sent to ER.   Today patient seen on arrival to [facility], he appears comfortable.  He is extremely hard of hearing, possibly mildly confused, and does not have the hearing aids so communication is almost impossible.  Denies any shortness of breath and does not have any significant complaints  ED Course: in the ER he was afebrile 98.2, tachypneic with RR of 29, requiring NRB. BP stable. CXR shows right mid lung infiltrate. Has elevated lactic acid which improved with fluids. Troponin is mildly elevated but stable. CBC, BMP otherwise unremarkable.   Review of Systems: Unable to obtain review of systems as detailed above  Past Medical History:  Diagnosis Date  . BPH (benign prostatic hyperplasia)     No past surgical history on file.   reports that he has never smoked. He has never used smokeless tobacco. He reports that he does not drink alcohol or use drugs.  Allergies  Allergen Reactions  . Aspirin Nausea Only    In high doses    Family history reviewed and non contributory   Prior to Admission medications   Medication Sig Start Date End Date Taking? Authorizing Provider  acetaminophen (TYLENOL) 500 MG tablet Take 500 mg by mouth every 4 (four) hours as needed (pain in finger(s)).    [provider]  aspirin 81 MG tablet  Take 81 mg by mouth daily.    [provider]  calcium carbonate (OS-CAL) 1250 (500 Ca) MG chewable tablet Chew 1 tablet by mouth daily.    [provider]  carbamide peroxide (DEBROX) 6.5 % OTIC solution Place 5 drops into both ears daily as needed (impacted cerumen).    [provider]  cefTRIAXone (ROCEPHIN) 1 g injection Inject 1 g into the muscle daily. 07/12/19 07/14/19  [provider]  dexamethasone (DECADRON) 6 MG tablet Take 6 mg by mouth daily.    [provider]  Multiple Vitamin (MULTIVITAMIN WITH MINERALS) TABS tablet Take 1 tablet by mouth daily.    [provider]  nitrofurantoin, macrocrystal-monohydrate, (MACROBID) 100 MG capsule Take 100 mg by mouth 2 (two) times daily. 07/10/19 15-Aug-2019  [provider]  tamsulosin (FLOMAX) 0.4 MG CAPS capsule Take 0.4 mg by mouth 2 (two) times daily.  06/01/16   [provider]    Physical Exam: There were no vitals filed for this visit.  Constitutional: NAD, calm, slightly tachypneic Eyes: PERRL, lids and conjunctivae normal ENMT: Mucous membranes are moist.  Neck: normal, supple, no masses, no thyromegaly Respiratory: Coarse breath sounds bilaterally, diffuse rhonchi, no wheezing Cardiovascular: Regular rate and rhythm, no murmurs / rubs / gallops. No extremity edema. Abdomen: no tenderness, no masses palpated. Bowel sounds positive.  Musculoskeletal: no clubbing / cyanosis.  Skin: no rashes Neurologic: Grossly nonfocal, equal  strength  Labs on Admission: I have personally reviewed following labs and imaging studies  CBC: Recent Labs  Lab 06/20/2019 0442  WBC 8.1  NEUTROABS 7.5  HGB 13.0  HCT 38.9*  MCV 92.8  PLT [id number]*   Basic Metabolic Panel: Recent Labs  Lab 07/05/2019 0442  NA 137  K 4.0  CL 104  CO2 22  GLUCOSE 157*  BUN 43*  CREATININE 1.01  CALCIUM 7.9*   GFR: Estimated Creatinine Clearance: 41.2 mL/min (by C-G formula based on SCr of 1.01  mg/dL). Liver Function Tests: Recent Labs  Lab 07/05/2019 0442  AST 37  ALT 37  ALKPHOS 44  BILITOT 0.7  PROT 5.5*  ALBUMIN 2.9*   No results for input(s): LIPASE, AMYLASE in the last 168 hours. No results for input(s): AMMONIA in the last 168 hours. Coagulation Profile: No results for input(s): INR, PROTIME in the last 168 hours. Cardiac Enzymes: No results for input(s): CKTOTAL, CKMB, CKMBINDEX, TROPONINI in the last 168 hours. BNP (last 3 results) No results for input(s): PROBNP in the last 8760 hours. HbA1C: No results for input(s): HGBA1C in the last 72 hours. CBG: No results for input(s): GLUCAP in the last 168 hours. Lipid Profile: No results for input(s): CHOL, HDL, LDLCALC, TRIG, CHOLHDL, LDLDIRECT in the last 72 hours. Thyroid Function Tests: No results for input(s): TSH, T4TOTAL, FREET4, T3FREE, THYROIDAB in the last 72 hours. Anemia Panel: No results for input(s): VITAMINB12, FOLATE, FERRITIN, TIBC, IRON, RETICCTPCT in the last 72 hours. Urine analysis:    Component Value Date/Time   COLORURINE YELLOW (A) 06/27/2019 0801   APPEARANCEUR HAZY (A) 07/04/2019 0801   APPEARANCEUR Clear 10/11/2017 1528   LABSPEC 1.026 06/25/2019 0801   PHURINE 5.0 07/12/2019 0801   GLUCOSEU NEGATIVE 06/14/2019 0801   HGBUR NEGATIVE 06/28/2019 0801   BILIRUBINUR NEGATIVE 07/01/2019 0801   BILIRUBINUR Negative 10/11/2017 [place] 06/26/2019 0801   PROTEINUR 30 (A) 06/27/2019 0801   NITRITE NEGATIVE 06/17/2019 0801   LEUKOCYTESUR MODERATE (A) 06/18/2019 0801     Radiological Exams on Admission: Dg Chest Port 1 View  Result Date: 06/20/2019 CLINICAL DATA:  Shortness of breath EXAM: PORTABLE CHEST 1 VIEW COMPARISON:  03/22/2016 FINDINGS: Cardiomediastinal contours are normal. There is calcific aortic atherosclerosis. Hazy opacity in the right mid lung. No pulmonary edema. No pleural effusion or pneumothorax. IMPRESSION: Hazy opacity in the right mid lung may  indicate developing consolidation. Electronically Signed   By: Ulyses Jarred M.D.   On: 06/30/2019 04:36   EKG: Independently reviewed. Sinus rhythm, RBBB  Assessment/Plan Active Problems:   Lymphoma (HCC)   Bundle branch block, right   Benign prostatic hyperplasia   History of CVA (cerebrovascular accident)   Hypoxia   COVID-19 virus infection   Principal Problem Acute on chronic hypoxic respiratory failure due to Covid 19 pneumonia -place on steroids, Remdesivir, severely hypoxic requiring 15 L high flow -monitor inflammatory markers -Check blood work Midwife and monitor daily  Active Problems Goals of care -Discussed with the patient's daughter over the phone, she confirms DNR/DNI, she does not want aggressive measures such as ICU transfer or pressors, no heroics.  Discussed with her that he is currently requiring 15 L of oxygen and is quite hypoxic, and if he is not responding to treatment he may pass from this.  She wants [place] to make sure that he would be comfortable and not suffer through this, and if he is to get worse we will transition to comfort measures  BPH -Resume home medications  History of CVA, lymphoma, HLD -appear stable, outpatient follow up -Continue aspirin  DVT prophylaxis: lovenox  Code Status: DNR/DNI  Family Communication: d/w daughter over the phone, [name] [id number] Disposition Plan: TBD Consults called: none     Marzetta Board, MD, PhD [facility]  Contact via [email or web address]  [place] P: [phone] F: [phone]   07/12/2019, 4:12 PM

## 2019-07-13 NOTE — ED Notes (Signed)
Pt lunch tray placed at bedside. Pt denies being hungry at this time. Pt stated he was thirsty. Given some sips of cola. Pt stated he was warm at this time, denied needing another blanket. Placed on high-flow [place] at this time. Taking quick shallow breaths.

## 2019-07-13 NOTE — Progress Notes (Signed)
Pt. has been having frequent Couplet PVCs.  v/S are now 98/49, 34, 99%, 101.  Dr. Alcario Drought page.

## 2019-07-13 NOTE — ED Provider Notes (Signed)
[facility] Emergency Department Provider Note   ____________________________________________   First MD Initiated Contact with Patient 06/21/2019 0408     (approximate)  I have reviewed the triage vital signs and the nursing notes.   HISTORY  Chief Complaint Shortness of Breath    HPI Jeffery Walsh. is a 85 y.o. male brought to the ED via [facility] with a chief complaint of shortness of breath.  Patient is on 2 L continuous oxygen.  Reportedly staff had to bump him up to 4 L with saturations in the 80%'s.  Arrives to the ED wearing nonrebreather with saturations of 99%.  Reportedly staff sent patient for "not able to be comfortable" which is an indication on his MOST form for transfer to the ED.  Patient voicing no complaints at this time.       Past Medical History:  Diagnosis Date  . BPH (benign prostatic hyperplasia)     Patient Active Problem List   Diagnosis Date Noted  . Benign prostatic hyperplasia 06/30/2016  . C1 cervical fracture ([place]) 06/30/2016  . Fall 06/30/2016  . History of CVA (cerebrovascular accident) 06/30/2016  . Phimosis 02/22/2016  . Urinary urgency 02/22/2016  . Carotid artery thrombosis 02/22/2016  . HLD (hyperlipidemia) 02/22/2016  . Adult hypothyroidism 02/22/2016  . Arthritis, degenerative 02/22/2016  . Lymphoma ([place]) 02/22/2016  . Kidney cysts 02/22/2016  . Bundle branch block, right 02/22/2016    History reviewed. No pertinent surgical history.  Prior to Admission medications   Medication Sig Start Date End Date Taking? Authorizing Provider  acetaminophen (TYLENOL) 500 MG tablet Take 500 mg by mouth every 4 (four) hours as needed (pain in finger(s)).   Yes [provider]  aspirin 81 MG tablet Take 81 mg by mouth daily.   Yes [provider]  calcium carbonate (OS-CAL) 1250 (500 Ca) MG chewable tablet Chew 1 tablet by mouth daily.   Yes [provider]  carbamide  peroxide (DEBROX) 6.5 % OTIC solution Place 5 drops into both ears daily as needed (impacted cerumen).   Yes [provider]  cefTRIAXone (ROCEPHIN) 1 g injection Inject 1 g into the muscle daily. 07/12/19 07/14/19 Yes [provider]  dexamethasone (DECADRON) 6 MG tablet Take 6 mg by mouth daily.   Yes [provider]  Multiple Vitamin (MULTIVITAMIN WITH MINERALS) TABS tablet Take 1 tablet by mouth daily.   Yes [provider]  nitrofurantoin, macrocrystal-monohydrate, (MACROBID) 100 MG capsule Take 100 mg by mouth 2 (two) times daily. 07/10/19 08/08/2019 Yes [provider]  tamsulosin (FLOMAX) 0.4 MG CAPS capsule Take 0.4 mg by mouth 2 (two) times daily.  06/01/16  Yes [provider]    Allergies Aspirin  No family history on file.  Social History Social History   Tobacco Use  . Smoking status: Never Smoker  . Smokeless tobacco: Never Used  Substance Use Topics  . Alcohol use: No  . Drug use: No    Review of Systems  Constitutional: No fever/chills Eyes: No visual changes. ENT: No sore throat. Cardiovascular: Denies chest pain. Respiratory: Positive for shortness of breath. Gastrointestinal: No abdominal pain.  No nausea, no vomiting.  No diarrhea.  No constipation. Genitourinary: Negative for dysuria. Musculoskeletal: Negative for back pain. Skin: Negative for rash. Neurological: Negative for headaches, focal weakness or numbness.   ____________________________________________   PHYSICAL EXAM:  VITAL SIGNS: ED Triage Vitals  Enc Vitals Group     BP  Pulse      Resp      Temp      Temp src      SpO2      Weight      Height      Head Circumference      Peak Flow      Pain Score      Pain Loc      Pain Edu?      Excl. in [place]?     Constitutional: Alert and oriented.  Elderly appearing and in mild to moderate acute distress. Eyes: Conjunctivae are normal. PERRL. EOMI. Head: Atraumatic. Nose: No  congestion/rhinnorhea. Mouth/Throat: Mucous membranes are moist.  Oropharynx non-erythematous. Neck: No stridor.   Cardiovascular: Normal rate, regular rhythm. Grossly normal heart sounds.  Good peripheral circulation. Respiratory: Increased respiratory effort.  No retractions. Lungs with scattered rhonchi. Gastrointestinal: Soft and nontender. No distention. No abdominal bruits. No CVA tenderness. Musculoskeletal: No lower extremity tenderness nor edema.  No joint effusions. Neurologic:  Normal speech and language. No gross focal neurologic deficits are appreciated.  Skin:  Skin is warm, dry and intact. No rash noted. Psychiatric: Mood and affect are normal. Speech and behavior are normal.  ____________________________________________   LABS (all labs ordered are listed, but only abnormal results are displayed)  Labs Reviewed  SARS CORONAVIRUS 2 BY RT PCR (HOSPITAL ORDER, [place] [facility]) - Abnormal; Notable for the following components:      Result Value   SARS Coronavirus 2 POSITIVE (*)    All other components within normal limits  LACTIC ACID, PLASMA - Abnormal; Notable for the following components:   Lactic Acid, Venous 2.7 (*)    All other components within normal limits  CBC WITH DIFFERENTIAL/PLATELET - Abnormal; Notable for the following components:   RBC 4.19 (*)    HCT 38.9 (*)    Platelets 131 (*)    Lymphs Abs 0.2 (*)    All other components within normal limits  COMPREHENSIVE METABOLIC PANEL - Abnormal; Notable for the following components:   Glucose, Bld 157 (*)    BUN 43 (*)    Calcium 7.9 (*)    Total Protein 5.5 (*)    Albumin 2.9 (*)    All other components within normal limits  TROPONIN I (HIGH SENSITIVITY) - Abnormal; Notable for the following components:   Troponin I (High Sensitivity) 45 (*)    All other components within normal limits  CULTURE, BLOOD (ROUTINE X 2)  CULTURE, BLOOD (ROUTINE X 2)  URINE CULTURE  LACTIC ACID, PLASMA   URINALYSIS, COMPLETE (UACMP) WITH MICROSCOPIC  C-REACTIVE PROTEIN  FIBRIN DERIVATIVES D-DIMER ([facility] ONLY)  TROPONIN I (HIGH SENSITIVITY)   ____________________________________________  EKG  ED ECG REPORT I, SUNG,JADE J, the attending physician, personally viewed and interpreted this ECG.   Date: 07/03/2019  EKG Time: 0410  Rate: 55  Rhythm: normal EKG, normal sinus rhythm  Axis: Normal  Intervals:none  ST&T Change: Nonspecific  ____________________________________________  RADIOLOGY  ED MD interpretation: Right midlung consolidation  Official radiology report(s): Dg Chest Port 1 View  Result Date: 06/19/2019 CLINICAL DATA:  Shortness of breath EXAM: PORTABLE CHEST 1 VIEW COMPARISON:  03/22/2016 FINDINGS: Cardiomediastinal contours are normal. There is calcific aortic atherosclerosis. Hazy opacity in the right mid lung. No pulmonary edema. No pleural effusion or pneumothorax. IMPRESSION: Hazy opacity in the right mid lung may indicate developing consolidation. Electronically Signed   By: Ulyses Jarred M.D.   On: 06/20/2019 04:36  ____________________________________________   PROCEDURES  Procedure(s) performed (including Critical Care):  Procedures   ____________________________________________   INITIAL IMPRESSION / ASSESSMENT AND PLAN / ED COURSE  As part of my medical decision making, I reviewed the following data within the [place] notes reviewed and incorporated, Labs reviewed, EKG interpreted, Old chart reviewed, Radiograph reviewed and Notes from prior ED visits     Hosia Cardella. was evaluated in Emergency Department on 06/17/2019 for the symptoms described in the history of present illness. He was evaluated in the context of the global COVID-19 pandemic, which necessitated consideration that the patient might be at risk for infection with the SARS-CoV-2 virus that causes COVID-19. Institutional protocols and algorithms that  pertain to the evaluation of patients at risk for COVID-19 are in a state of rapid change based on information released by regulatory bodies including the CDC and federal and state organizations. These policies and algorithms were followed during the patient's care in the ED.    85 year old male sent from SNF for shortness of breath. Differential includes, but is not limited to, viral syndrome, bronchitis including COPD exacerbation, pneumonia, reactive airway disease including asthma, CHF including exacerbation with or without pulmonary/interstitial edema, pneumothorax, ACS, thoracic trauma, and pulmonary embolism.  Will obtain septic work-up; add rapid Covid swab.    Clinical Course as of Jul 13 711  Sat Jul 13, 2019  0418 Patient has a MOST form which indicates no CPR, no IV fluids. Will try to find number for family member.   [JS]  0606 Spoke with patient's daughter [name] at [phone] and updated her of patient's condition.  She did verify that patient is DNR/DNI.  Family does not want vasopressors, but they do want IV fluids and antibiotics.  In the event that patient is positive for COVID-19, she does want him transferred to [facility].  Daughter did mention that he was on antibiotics this week although she does not know why.  MAR does show he was receiving IM Rocephin.   [JS]  0621 Covid is positive.  Will contact [facility] for transfer.   [JS]  [id number] Updated patient's daughter by telephone.  She does wish for him to be transferred for Covid treatment.  Family does not want blood transfusion.  Family would not want cardiac intervention such as Cath Lab.  In summary, patient and family's wishes are to treat the pneumonia, IV fluids and COVID-19 treatment.   [JS]  [id number] Spoke with Dr. Cruzita Lederer who accepts patient to [facility].   [JS]    Clinical Course User Index [JS] Paulette Blanch, MD     ____________________________________________   FINAL CLINICAL IMPRESSION(S) / ED DIAGNOSES   Final diagnoses:  Shortness of breath  HCAP (healthcare-associated pneumonia)  Sepsis, due to unspecified organism, unspecified whether acute organ dysfunction present [facility])  COVID-19     ED Discharge Orders    None       Note:  This document was prepared using Dragon voice recognition software and may include unintentional dictation errors.   Paulette Blanch, MD 07/06/2019 [phone]

## 2019-07-14 DIAGNOSIS — Z8673 Personal history of transient ischemic attack (TIA), and cerebral infarction without residual deficits: Secondary | ICD-10-CM

## 2019-07-14 LAB — C-REACTIVE PROTEIN: CRP: 10.3 mg/dL — ABNORMAL HIGH (ref <1.0)

## 2019-07-14 LAB — CBC WITH DIFFERENTIAL/PLATELET
Abs Immature Granulocytes: 0.06 10*3/uL (ref 0.00–0.07)
Basophils Absolute: 0 10*3/uL (ref 0.0–0.1)
Basophils Relative: 0 %
Eosinophils Absolute: 0 10*3/uL (ref 0.0–0.5)
Eosinophils Relative: 0 %
HCT: 38.2 % — ABNORMAL LOW (ref 39.0–52.0)
Hemoglobin: 12.1 g/dL — ABNORMAL LOW (ref 13.0–17.0)
Immature Granulocytes: 1 %
Lymphocytes Relative: 2 %
Lymphs Abs: 0.1 10*3/uL — ABNORMAL LOW (ref 0.7–4.0)
MCH: 30.9 pg (ref 26.0–34.0)
MCHC: 31.7 g/dL (ref 30.0–36.0)
MCV: 97.4 fL (ref 80.0–100.0)
Monocytes Absolute: 0.3 10*3/uL (ref 0.1–1.0)
Monocytes Relative: 3 %
Neutro Abs: 7.8 10*3/uL — ABNORMAL HIGH (ref 1.7–7.7)
Neutrophils Relative %: 94 %
Platelets: 134 10*3/uL — ABNORMAL LOW (ref 150–400)
RBC: 3.92 MIL/uL — ABNORMAL LOW (ref 4.22–5.81)
RDW: 14.6 % (ref 11.5–15.5)
WBC: 8.3 10*3/uL (ref 4.0–10.5)
nRBC: 0 % (ref 0.0–0.2)

## 2019-07-14 LAB — COMPREHENSIVE METABOLIC PANEL
ALT: 32 U/L (ref 0–44)
AST: 33 U/L (ref 15–41)
Albumin: 2.5 g/dL — ABNORMAL LOW (ref 3.5–5.0)
Alkaline Phosphatase: 42 U/L (ref 38–126)
Anion gap: 14 (ref 5–15)
BUN: 55 mg/dL — ABNORMAL HIGH (ref 8–23)
CO2: 18 mmol/L — ABNORMAL LOW (ref 22–32)
Calcium: 7.4 mg/dL — ABNORMAL LOW (ref 8.9–10.3)
Chloride: 110 mmol/L (ref 98–111)
Creatinine, Ser: 1.12 mg/dL (ref 0.61–1.24)
GFR calc Af Amer: >60 mL/min (ref >60)
GFR calc non Af Amer: 54 mL/min — ABNORMAL LOW (ref >60)
Glucose, Bld: 143 mg/dL — ABNORMAL HIGH (ref 70–99)
Potassium: 3.8 mmol/L (ref 3.5–5.1)
Sodium: 142 mmol/L (ref 135–145)
Total Bilirubin: 0.5 mg/dL (ref 0.3–1.2)
Total Protein: 5 g/dL — ABNORMAL LOW (ref 6.5–8.1)

## 2019-07-14 LAB — D-DIMER, QUANTITATIVE: D-Dimer, Quant: 4.34 ug/mL-FEU — ABNORMAL HIGH (ref 0.00–0.50)

## 2019-07-14 LAB — URINE CULTURE: Culture: NO GROWTH

## 2019-07-14 LAB — HEMOGLOBIN AND HEMATOCRIT, BLOOD
HCT: 39.6 % (ref 39.0–52.0)
Hemoglobin: 12.5 g/dL — ABNORMAL LOW (ref 13.0–17.0)

## 2019-07-14 LAB — MAGNESIUM: Magnesium: 2.4 mg/dL (ref 1.7–2.4)

## 2019-07-14 LAB — LACTIC ACID, PLASMA
Lactic Acid, Venous: 2.9 mmol/L (ref 0.5–1.9)
Lactic Acid, Venous: 3.2 mmol/L (ref 0.5–1.9)

## 2019-07-14 MED ORDER — SODIUM CHLORIDE 0.9 % IV SOLN
8.0000 mg/h | INTRAVENOUS | Status: DC
  Administered 2019-07-14: 8 mg/h via INTRAVENOUS
  Filled 2019-07-14 (×2): qty 80

## 2019-07-14 MED ORDER — LORAZEPAM 2 MG/ML IJ SOLN
0.5000 mg | INTRAMUSCULAR | Status: DC | PRN
  Administered 2019-07-15 – 2019-07-17 (×3): 0.5 mg via INTRAVENOUS
  Filled 2019-07-14 (×3): qty 1

## 2019-07-14 MED ORDER — PANTOPRAZOLE SODIUM 40 MG IV SOLR
40.0000 mg | Freq: Two times a day (BID) | INTRAVENOUS | Status: DC
  Administered 2019-07-14 – 2019-07-17 (×6): 40 mg via INTRAVENOUS
  Filled 2019-07-14 (×6): qty 40

## 2019-07-14 MED ORDER — MORPHINE SULFATE (PF) 2 MG/ML IV SOLN
2.0000 mg | INTRAVENOUS | Status: DC | PRN
  Administered 2019-07-14 – 2019-07-17 (×9): 2 mg via INTRAVENOUS
  Filled 2019-07-14 (×9): qty 1

## 2019-07-14 MED ORDER — PANTOPRAZOLE SODIUM 40 MG IV SOLR: 40.0000 mg | Freq: Two times a day (BID) | INTRAVENOUS | Status: DC

## 2019-07-14 MED ORDER — SODIUM CHLORIDE 0.9 % IV SOLN
80.0000 mg | Freq: Once | INTRAVENOUS | Status: AC
  Administered 2019-07-14: 80 mg via INTRAVENOUS
  Filled 2019-07-14: qty 80

## 2019-07-14 NOTE — Progress Notes (Signed)
Notified son of progress.  All questions were answered and this nurse's contact number shared for further communication.   

## 2019-07-14 NOTE — Progress Notes (Signed)
Pt has opportunity to see family. Son and daughter visited family member in private room on central hall.

## 2019-07-14 NOTE — Plan of Care (Signed)

## 2019-07-14 NOTE — Progress Notes (Signed)
Facetimed family members. Will arrange time to see family.

## 2019-07-14 NOTE — Progress Notes (Signed)
2 large melanotic BMs this AM, last x30 mins ago.  Lactate 2.9.  Concerned about GIB.  1) Protonix IV bolus and infusion 2) Repeat H/H now and Q6H 3) stopping Lovenox (last given just DVT ppx dose at 2000 last evening so ~10hrs ago, also spoke with pharmacy they dont think protamine going to do much at this point).  Also stopping ASA. 4) send stool for hemoccult 5) call GI in AM, but patient is requiring 15L NRB to maintain sats in the 90s (15L high flow and he desats into the 70s), DNR/DNI, is NOT going to be a candidate for EGD I would think.  (Goals of care on H+P are reviewed). 6) HR 90s-110, rechecking BP, but unless BP low: dont want to start IVF because of his very poor respiratory status

## 2019-07-14 NOTE — Progress Notes (Addendum)
PROGRESS NOTE  Jeffery Walsh. [id number] DOB: 04-Jun-1918 DOA: 07/11/2019 PCP: Idelle Crouch, MD   LOS: 1 day   Brief Narrative / Interim history: Jeffery Gillin. is a 85 y.o. male with medical history significant of BPH, prior CVA who comes to the ER from his SNF ([facility]) with complaints of shortness of breath. He has a degree from chronic hypoxia on 2L [place] and reportedly he has had low oxygen levels at the SNF requiring increasing his O2 to 4L but still sats in the 80s thus he was sent to ER  Subjective / 24h Interval events: -poorly responsive this morning, on NRB. Overnight developed melanotic stools  Assessment & Plan: Active Problems:   Lymphoma ([place])   Bundle branch block, right   Benign prostatic hyperplasia   History of CVA (cerebrovascular accident)   Hypoxia   COVID-19 virus infection   Acute hypoxemic respiratory failure due to COVID-19 [facility])   Principal Problem Acute hypoxic respiratory failure due to COVID-19 pneumonia -Patient remains profoundly hypoxic requiring 15 L high flow nasal cannula and nonrebreather on top barely maintaining sats in the low 90s -He was started on remdesivir as well as steroids on 10/31 however due to black stools and concerns for GI bleed steroids have now been discontinued.  Lovenox as well as aspirin was discontinued as well -Continue remdesivir  Active Problems Melanotic stools, concern for upper GI bleed -Patient without a history of upper GI bleed but has been on chronic aspirin.  Hemoglobin decreased a little bit but stable and patient does not need blood transfusions.  Lovenox, aspirin as well as steroids have been discontinued -Continue twice daily PPI  Goals of care -Called and updated the daughter this morning, patient is progressively declining despite treatment.  He is DNR/DNI, no escalation of care/ICU and no aggressive measures/procedures -Continue remdesivir, PPI and periodic blood monitoring however  after discussing with the daughter I will add comfort medications as well morphine/Ativan for distress -Anticipating hospital death  Scheduled Meds: . [START ON July 18, 2019] pantoprazole  40 mg Intravenous Q12H  . tamsulosin  0.4 mg Oral BID  . vitamin C  500 mg Oral Daily  . zinc sulfate  220 mg Oral Daily   Continuous Infusions: . pantoprozole (PROTONIX) infusion 8 mg/hr (07/14/19 0600)  . remdesivir 100 mg in NS 250 mL     PRN Meds:.acetaminophen, chlorpheniramine-HYDROcodone, guaiFENesin-dextromethorphan, LORazepam, morphine injection, ondansetron **OR** ondansetron (ZOFRAN) IV  DVT prophylaxis: SCDs Code Status: DNR Family Communication: d/w daughter over the phone Disposition Plan: TBD  Consultants:  None   Procedures:  None   Microbiology  None   Antimicrobials: None     Objective: Vitals:   07/14/19 0003 07/14/19 0400 07/14/19 0427 07/14/19 0830  BP: (!) 100/49 (!) 123/59  (!) 116/52  Pulse: 94 86  78  Resp: 17 (!) 24  (!) 27  Temp: 99.5 F (37.5 C) 99.2 F (37.3 C)  98.8 F (37.1 C)  TempSrc: Axillary Axillary  Axillary  SpO2: 92% 95% 90% 92%  Weight:        Intake/Output Summary (Last 24 hours) at 07/14/2019 0902 Last data filed at 07/14/2019 [id number] Gross per 24 hour  Intake 460 ml  Output 200 ml  Net 260 ml   Filed Weights   07/08/2019 1832  Weight: 68.1 kg    Examination:  Constitutional: Poorly responsive ENMT: Dry mucous membranes.  Cyanotic lips Respiratory: Shallow respirations, no wheezing, no crackles.  Tachypneic and increased work of  breathing Cardiovascular: rrr, no new murmurs.  No edema Abdomen: no tenderness. Bowel sounds positive.  Skin: no rashes Neurologic: Unresponsive   Data Reviewed: I have independently reviewed following labs and imaging studies   CBC: Recent Labs  Lab 06/19/2019 0442 07/12/2019 1858 07/14/19 0029 07/14/19 0530  WBC 8.1 15.7* 8.3  --   NEUTROABS 7.5 14.5* 7.8*  --   HGB 13.0 14.2 12.1* 12.5*  HCT  38.9* 43.9 38.2* 39.6  MCV 92.8 98.0 97.4  --   PLT 131* 194 134*  --    Basic Metabolic Panel: Recent Labs  Lab 06/21/2019 0442 06/28/2019 1858 07/14/19 0029 07/14/19 0308  NA 137 142 142  --   K 4.0 4.4 3.8  --   CL 104 109 110  --   CO2 22 17* 18*  --   GLUCOSE 157* 164* 143*  --   BUN 43* 46* 55*  --   CREATININE 1.01 1.06 1.12  --   CALCIUM 7.9* 7.7* 7.4*  --   MG  --   --   --  2.4   GFR: Estimated Creatinine Clearance: 34.6 mL/min (by C-G formula based on SCr of 1.12 mg/dL). Liver Function Tests: Recent Labs  Lab 06/22/2019 0442 07/05/2019 1858 07/14/19 0029  AST 37 39 33  ALT 37 37 32  ALKPHOS 44 51 42  BILITOT 0.7 0.7 0.5  PROT 5.5* 6.0* 5.0*  ALBUMIN 2.9* 2.9* 2.5*   No results for input(s): LIPASE, AMYLASE in the last 168 hours. No results for input(s): AMMONIA in the last 168 hours. Coagulation Profile: No results for input(s): INR, PROTIME in the last 168 hours. Cardiac Enzymes: No results for input(s): CKTOTAL, CKMB, CKMBINDEX, TROPONINI in the last 168 hours. BNP (last 3 results) No results for input(s): PROBNP in the last 8760 hours. HbA1C: No results for input(s): HGBA1C in the last 72 hours. CBG: No results for input(s): GLUCAP in the last 168 hours. Lipid Profile: No results for input(s): CHOL, HDL, LDLCALC, TRIG, CHOLHDL, LDLDIRECT in the last 72 hours. Thyroid Function Tests: No results for input(s): TSH, T4TOTAL, FREET4, T3FREE, THYROIDAB in the last 72 hours. Anemia Panel: No results for input(s): VITAMINB12, FOLATE, FERRITIN, TIBC, IRON, RETICCTPCT in the last 72 hours. Urine analysis:    Component Value Date/Time   COLORURINE YELLOW (A) 06/22/2019 0801   APPEARANCEUR HAZY (A) 06/15/2019 0801   APPEARANCEUR Clear 10/11/2017 1528   LABSPEC 1.026 07/05/2019 0801   PHURINE 5.0 07/11/2019 0801   GLUCOSEU NEGATIVE 07/11/2019 0801   HGBUR NEGATIVE 06/21/2019 0801   BILIRUBINUR NEGATIVE 07/10/2019 0801   BILIRUBINUR Negative 10/11/2017 1528    KETONESUR NEGATIVE 06/23/2019 0801   PROTEINUR 30 (A) 07/12/2019 0801   NITRITE NEGATIVE 06/20/2019 0801   LEUKOCYTESUR MODERATE (A) 06/29/2019 0801   Sepsis Labs: Invalid input(s): PROCALCITONIN, LACTICIDVEN  Recent Results (from the past 240 hour(s))  SARS Coronavirus 2 by RT PCR (hospital order, performed in [facility] hospital lab) Nasopharyngeal Nasopharyngeal Swab     Status: Abnormal   Collection Time: 06/23/2019  4:42 AM   Specimen: Nasopharyngeal Swab  Result Value Ref Range Status   SARS Coronavirus 2 POSITIVE (A) NEGATIVE Final    Comment: RESULT CALLED TO, READ BACK BY AND VERIFIED WITH: REBECCA LYNN AT AT:[id number] 07/09/2019.PMF (NOTE) If result is NEGATIVE SARS-CoV-2 target nucleic acids are NOT DETECTED. The SARS-CoV-2 RNA is generally detectable in upper and lower  respiratory specimens during the acute phase of infection. The lowest  concentration of SARS-CoV-2 viral  copies this assay can detect is 250  copies / mL. A negative result does not preclude SARS-CoV-2 infection  and should not be used as the sole basis for treatment or other  patient management decisions.  A negative result may occur with  improper specimen collection / handling, submission of specimen other  than nasopharyngeal swab, presence of viral mutation(s) within the  areas targeted by this assay, and inadequate number of viral copies  (<250 copies / mL). A negative result must be combined with clinical  observations, patient history, and epidemiological information. If result is POSITIVE SARS-CoV-2 target nucleic acids are DETECTED. T he SARS-CoV-2 RNA is generally detectable in upper and lower  respiratory specimens during the acute phase of infection.  Positive  results are indicative of active infection with SARS-CoV-2.  Clinical  correlation with patient history and other diagnostic information is  necessary to determine patient infection status.  Positive results do  not rule out bacterial  infection or co-infection with other viruses. If result is PRESUMPTIVE POSTIVE SARS-CoV-2 nucleic acids MAY BE PRESENT.   A presumptive positive result was obtained on the submitted specimen  and confirmed on repeat testing.  While 2019 novel coronavirus  (SARS-CoV-2) nucleic acids may be present in the submitted sample  additional confirmatory testing may be necessary for epidemiological  and / or clinical management purposes  to differentiate between  SARS-CoV-2 and other Sarbecovirus currently known to infect humans.  If clinically indicated additional testing with an alternate test  methodology [phone]) is  advised. The SARS-CoV-2 RNA is generally  detectable in upper and lower respiratory specimens during the acute  phase of infection. The expected result is Negative. Fact Sheet for Patients:  StrictlyIdeas.no Fact Sheet for Healthcare Providers: BankingDealers.co.za This test is not yet approved or cleared by the [place] FDA and has been authorized for detection and/or diagnosis of SARS-CoV-2 by FDA under an Emergency Use Authorization (EUA).  This EUA will remain in effect (meaning this test can be used) for the duration of the COVID-19 declaration under Section 564(b)(1) of the Act, 21 U.S.C. section 360bbb-3(b)(1), unless the authorization is terminated or revoked sooner. Performed at [facility], [address], [place], [place] [address]       Radiology Studies: Dg Chest [place] 1 View  Result Date: 07/02/2019 CLINICAL DATA:  Shortness of breath EXAM: PORTABLE CHEST 1 VIEW COMPARISON:  03/22/2016 FINDINGS: Cardiomediastinal contours are normal. There is calcific aortic atherosclerosis. Hazy opacity in the right mid lung. No pulmonary edema. No pleural effusion or pneumothorax. IMPRESSION: Hazy opacity in the right mid lung may indicate developing consolidation. Electronically Signed   By: Ulyses Jarred M.D.    On: 07/04/2019 04:36    Marzetta Board, MD, PhD [facility]  Contact via  [email or web address]  [place] P: [phone] F: [phone]

## 2019-07-14 NOTE — Progress Notes (Signed)
Jeffery Walsh from Lab reported Critical Lactic acid of 2.9 on Pt..  Pt. had 2 large black BM this morning. He also continues on the [place] b/c his desats to 33s on NCHF. Dr. Alcario Drought page.

## 2019-07-14 DEATH — deceased

## 2019-07-15 LAB — CBC WITH DIFFERENTIAL/PLATELET
Abs Immature Granulocytes: 0.08 10*3/uL — ABNORMAL HIGH (ref 0.00–0.07)
Basophils Absolute: 0 10*3/uL (ref 0.0–0.1)
Basophils Relative: 0 %
Eosinophils Absolute: 0 10*3/uL (ref 0.0–0.5)
Eosinophils Relative: 0 %
HCT: 38.4 % — ABNORMAL LOW (ref 39.0–52.0)
Hemoglobin: 12.4 g/dL — ABNORMAL LOW (ref 13.0–17.0)
Immature Granulocytes: 1 %
Lymphocytes Relative: 2 %
Lymphs Abs: 0.3 10*3/uL — ABNORMAL LOW (ref 0.7–4.0)
MCH: 31.2 pg (ref 26.0–34.0)
MCHC: 32.3 g/dL (ref 30.0–36.0)
MCV: 96.7 fL (ref 80.0–100.0)
Monocytes Absolute: 0.4 10*3/uL (ref 0.1–1.0)
Monocytes Relative: 4 %
Neutro Abs: 10.9 10*3/uL — ABNORMAL HIGH (ref 1.7–7.7)
Neutrophils Relative %: 93 %
Platelets: 167 10*3/uL (ref 150–400)
RBC: 3.97 MIL/uL — ABNORMAL LOW (ref 4.22–5.81)
RDW: 14.7 % (ref 11.5–15.5)
WBC: 11.7 10*3/uL — ABNORMAL HIGH (ref 4.0–10.5)
nRBC: 0 % (ref 0.0–0.2)

## 2019-07-15 LAB — COMPREHENSIVE METABOLIC PANEL
ALT: 24 U/L (ref 0–44)
AST: 27 U/L (ref 15–41)
Albumin: 2.7 g/dL — ABNORMAL LOW (ref 3.5–5.0)
Alkaline Phosphatase: 45 U/L (ref 38–126)
Anion gap: 12 (ref 5–15)
BUN: 46 mg/dL — ABNORMAL HIGH (ref 8–23)
CO2: 22 mmol/L (ref 22–32)
Calcium: 7.5 mg/dL — ABNORMAL LOW (ref 8.9–10.3)
Chloride: 112 mmol/L — ABNORMAL HIGH (ref 98–111)
Creatinine, Ser: 0.98 mg/dL (ref 0.61–1.24)
GFR calc Af Amer: >60 mL/min (ref >60)
GFR calc non Af Amer: >60 mL/min (ref >60)
Glucose, Bld: 127 mg/dL — ABNORMAL HIGH (ref 70–99)
Potassium: 3.8 mmol/L (ref 3.5–5.1)
Sodium: 146 mmol/L — ABNORMAL HIGH (ref 135–145)
Total Bilirubin: 0.5 mg/dL (ref 0.3–1.2)
Total Protein: 5.4 g/dL — ABNORMAL LOW (ref 6.5–8.1)

## 2019-07-15 LAB — D-DIMER, QUANTITATIVE: D-Dimer, Quant: 2.49 ug/mL-FEU — ABNORMAL HIGH (ref 0.00–0.50)

## 2019-07-15 LAB — C-REACTIVE PROTEIN: CRP: 13.1 mg/dL — ABNORMAL HIGH (ref <1.0)

## 2019-07-15 NOTE — Progress Notes (Signed)
Nutrition Brief Note  Patient identified on the Malnutrition Screening Tool (MST) Report  Pt with PMH of BPH, CVA admitted from his SNF with hypoxia due to COVID-19 PNA. Per MD pt is poorly responsive on NRB and developed melanotic stools overnight. Pt is now a DNR/DNI. Per MD note hospital death expected.   Wt Readings from Last 15 Encounters:  06/30/2019 68.1 kg  06/20/2019 83.9 kg  10/11/17 65.7 kg  10/07/16 67.2 kg  06/29/16 72.6 kg  06/22/16 67.7 kg  06/14/16 66.9 kg  04/19/16 66 kg  03/22/16 66.7 kg  02/22/16 68.4 kg  02/12/16 68 kg    Body mass index is 21.54 kg/m.   Current diet order is Regular, patient is consuming approximately 0% of meals at this time. Labs and medications reviewed.   No nutrition interventions warranted at this time. If nutrition issues arise, please consult RD.   [place], [place], [place] Pager [phone] After Hours Pager

## 2019-07-15 NOTE — Progress Notes (Addendum)
Pt intake is becoming more poor. Harder to suck from straw. Was able to take few bites of applesauce and few sips of Coke. Will continue to monitor.

## 2019-07-15 NOTE — Plan of Care (Signed)

## 2019-07-15 NOTE — Progress Notes (Signed)
PROGRESS NOTE  Jeffery Walsh. [id number] DOB: Jul 29, 1918 DOA: 07/05/2019 PCP: Juluis Pitch, MD   LOS: 2 days   Brief Narrative / Interim history: Jeffery Walsh. is a 85 y.o. male with medical history significant of BPH, prior CVA who comes to the ER from his SNF ([facility]) with complaints of shortness of breath. He has a degree from chronic hypoxia on 2L [place] and reportedly he has had low oxygen levels at the SNF requiring increasing his O2 to 4L but still sats in the 80s thus he was sent to ER  Subjective / 24h Interval events: -Eyes are open but does not interact much  Assessment & Plan: Active Problems:   Lymphoma ([place])   Bundle branch block, right   Benign prostatic hyperplasia   History of CVA (cerebrovascular accident)   Hypoxia   COVID-19 virus infection   Acute hypoxemic respiratory failure due to COVID-19 [facility])   Principal Problem Acute hypoxic respiratory failure due to COVID-19 pneumonia -Patient remains profoundly hypoxic requiring 15 L high flow nasal cannula and nonrebreather on top barely maintaining sats in the low 90s -He was started on remdesivir as well as steroids on 10/31 however due to black stools and concerns for GI bleed steroids have now been discontinued.  Lovenox as well as aspirin was discontinued as well -Continue remdesivir  Active Problems Melanotic stools, concern for upper GI bleed -Patient without a history of upper GI bleed but has been on chronic aspirin.  Hemoglobin decreased a little bit but stable and patient does not need blood transfusions.  Lovenox, aspirin as well as steroids have been discontinued -On PPI twice daily  Goals of care -Called and updated the daughter this morning, patient is progressively declining despite treatment.  He is DNR/DNI, no escalation of care/ICU and no aggressive measures/procedures -Continue remdesivir, PPI and periodic blood monitoring however after discussing with the daughter I will add  comfort medications as well morphine/Ativan for distress -Appears comfortable  Scheduled Meds: . pantoprazole (PROTONIX) IV  40 mg Intravenous Q12H  . tamsulosin  0.4 mg Oral BID  . vitamin C  500 mg Oral Daily  . zinc sulfate  220 mg Oral Daily   Continuous Infusions: . remdesivir 100 mg in NS 250 mL 100 mg (07/14/19 1726)   PRN Meds:.acetaminophen, chlorpheniramine-HYDROcodone, guaiFENesin-dextromethorphan, LORazepam, morphine injection, ondansetron **OR** ondansetron (ZOFRAN) IV  DVT prophylaxis: SCDs Code Status: DNR Family Communication: d/w daughter Jeffery Walsh over the phone, [phone] Disposition Plan: TBD  Consultants:  None   Procedures:  None   Microbiology  None   Antimicrobials: None     Objective: Vitals:   07/15/19 0545 07/15/19 0744 07/15/19 0754 07/15/19 0757  BP:  (!) 137/58    Pulse:  74 74 69  Resp: (!) 25 (!) 21 (!) 28 19  Temp:  (!) 97 F (36.1 C)    TempSrc:  Axillary    SpO2: 94% (!) 88% 95% 93%  Weight:        Intake/Output Summary (Last 24 hours) at 07/15/2019 1318 Last data filed at 07/14/2019 2200 Gross per 24 hour  Intake 960 ml  Output 200 ml  Net 760 ml   Filed Weights   07/09/2019 1832  Weight: 68.1 kg    Examination:  Constitutional: His eyes are open but minimally interactive with me ENMT: Dry mucous membranes Respiratory: Shallow respirations, tachypneic Cardiovascular: RRR  Data Reviewed: I have independently reviewed following labs and imaging studies   CBC: Recent Labs  Lab 06/14/2019 0442 07/07/2019 1858 07/14/19 0029 07/14/19 0530 07/15/19 0537  WBC 8.1 15.7* 8.3  --  11.7*  NEUTROABS 7.5 14.5* 7.8*  --  10.9*  HGB 13.0 14.2 12.1* 12.5* 12.4*  HCT 38.9* 43.9 38.2* 39.6 38.4*  MCV 92.8 98.0 97.4  --  96.7  PLT 131* 194 134*  --  [id number]   Basic Metabolic Panel: Recent Labs  Lab 06/14/2019 0442 06/14/2019 1858 07/14/19 0029 07/14/19 0308 07/15/19 0537  NA 137 142 142  --  146*  K 4.0 4.4 3.8  --  3.8   CL 104 109 110  --  112*  CO2 22 17* 18*  --  22  GLUCOSE 157* 164* 143*  --  127*  BUN 43* 46* 55*  --  46*  CREATININE 1.01 1.06 1.12  --  0.98  CALCIUM 7.9* 7.7* 7.4*  --  7.5*  MG  --   --   --  2.4  --    GFR: Estimated Creatinine Clearance: 39.6 mL/min (by C-G formula based on SCr of 0.98 mg/dL). Liver Function Tests: Recent Labs  Lab 06/14/2019 0442 07/12/2019 1858 07/14/19 0029 07/15/19 0537  AST 37 39 33 27  ALT 37 37 32 24  ALKPHOS 44 51 42 45  BILITOT 0.7 0.7 0.5 0.5  PROT 5.5* 6.0* 5.0* 5.4*  ALBUMIN 2.9* 2.9* 2.5* 2.7*   No results for input(s): LIPASE, AMYLASE in the last 168 hours. No results for input(s): AMMONIA in the last 168 hours. Coagulation Profile: No results for input(s): INR, PROTIME in the last 168 hours. Cardiac Enzymes: No results for input(s): CKTOTAL, CKMB, CKMBINDEX, TROPONINI in the last 168 hours. BNP (last 3 results) No results for input(s): PROBNP in the last 8760 hours. HbA1C: No results for input(s): HGBA1C in the last 72 hours. CBG: No results for input(s): GLUCAP in the last 168 hours. Lipid Profile: No results for input(s): CHOL, HDL, LDLCALC, TRIG, CHOLHDL, LDLDIRECT in the last 72 hours. Thyroid Function Tests: No results for input(s): TSH, T4TOTAL, FREET4, T3FREE, THYROIDAB in the last 72 hours. Anemia Panel: No results for input(s): VITAMINB12, FOLATE, FERRITIN, TIBC, IRON, RETICCTPCT in the last 72 hours. Urine analysis:    Component Value Date/Time   COLORURINE YELLOW (A) 07/07/2019 0801   APPEARANCEUR HAZY (A) 06/15/2019 0801   APPEARANCEUR Clear 10/11/2017 1528   LABSPEC 1.026 07/10/2019 0801   PHURINE 5.0 06/14/2019 0801   GLUCOSEU NEGATIVE 06/30/2019 0801   HGBUR NEGATIVE 07/12/2019 0801   BILIRUBINUR NEGATIVE 07/03/2019 0801   BILIRUBINUR Negative 10/11/2017 1528   KETONESUR NEGATIVE 07/12/2019 0801   PROTEINUR 30 (A) 06/24/2019 0801   NITRITE NEGATIVE 06/24/2019 0801   LEUKOCYTESUR MODERATE (A) 06/25/2019 0801    Sepsis Labs: Invalid input(s): PROCALCITONIN, LACTICIDVEN  Recent Results (from the past 240 hour(s))  Culture, blood (routine x 2)     Status: None (Preliminary result)   Collection Time: 06/21/2019  4:42 AM   Specimen: BLOOD  Result Value Ref Range Status   Specimen Description BLOOD LEFT FOREARM ANTERIOR  Final   Special Requests   Final    BOTTLES DRAWN AEROBIC AND ANAEROBIC Blood Culture adequate volume   Culture   Final    NO GROWTH 2 DAYS Performed at [facility], [address] [place], [place] [address]    Report Status PENDING  Incomplete  Culture, blood (routine x 2)     Status: None (Preliminary result)   Collection Time: 07/01/2019  4:42 AM   Specimen:  BLOOD  Result Value Ref Range Status   Specimen Description BLOOD LEFT FOREARM POSTERIAL  Final   Special Requests   Final    BOTTLES DRAWN AEROBIC AND ANAEROBIC Blood Culture adequate volume   Culture   Final    NO GROWTH 2 DAYS Performed at [facility], [address] [place], [place] [address]    Report Status PENDING  Incomplete  SARS Coronavirus 2 by RT PCR (hospital order, performed in [facility] hospital lab) Nasopharyngeal Nasopharyngeal Swab     Status: Abnormal   Collection Time: 06/28/2019  4:42 AM   Specimen: Nasopharyngeal Swab  Result Value Ref Range Status   SARS Coronavirus 2 POSITIVE (A) NEGATIVE Final    Comment: RESULT CALLED TO, READ BACK BY AND VERIFIED WITH: REBECCA LYNN AT [id number] 07/02/2019.PMF (NOTE) If result is NEGATIVE SARS-CoV-2 target nucleic acids are NOT DETECTED. The SARS-CoV-2 RNA is generally detectable in upper and lower  respiratory specimens during the acute phase of infection. The lowest  concentration of SARS-CoV-2 viral copies this assay can detect is 250  copies / mL. A negative result does not preclude SARS-CoV-2 infection  and should not be used as the sole basis for treatment or other  patient management decisions.  A negative result may occur with   improper specimen collection / handling, submission of specimen other  than nasopharyngeal swab, presence of viral mutation(s) within the  areas targeted by this assay, and inadequate number of viral copies  (<250 copies / mL). A negative result must be combined with clinical  observations, patient history, and epidemiological information. If result is POSITIVE SARS-CoV-2 target nucleic acids are DETECTED. T he SARS-CoV-2 RNA is generally detectable in upper and lower  respiratory specimens during the acute phase of infection.  Positive  results are indicative of active infection with SARS-CoV-2.  Clinical  correlation with patient history and other diagnostic information is  necessary to determine patient infection status.  Positive results do  not rule out bacterial infection or Walsh-infection with other viruses. If result is PRESUMPTIVE POSTIVE SARS-CoV-2 nucleic acids MAY BE PRESENT.   A presumptive positive result was obtained on the submitted specimen  and confirmed on repeat testing.  While 2019 novel coronavirus  (SARS-CoV-2) nucleic acids may be present in the submitted sample  additional confirmatory testing may be necessary for epidemiological  and / or clinical management purposes  to differentiate between  SARS-CoV-2 and other Sarbecovirus currently known to infect humans.  If clinically indicated additional testing with an alternate test  methodology [phone]) is  advised. The SARS-CoV-2 RNA is generally  detectable in upper and lower respiratory specimens during the acute  phase of infection. The expected result is Negative. Fact Sheet for Patients:  StrictlyIdeas.no Fact Sheet for Healthcare Providers: BankingDealers.Walsh.za This test is not yet approved or cleared by the [place] FDA and has been authorized for detection and/or diagnosis of SARS-CoV-2 by FDA under an Emergency Use Authorization (EUA).  This EUA will  remain in effect (meaning this test can be used) for the duration of the COVID-19 declaration under Section 564(b)(1) of the Act, 21 U.S.C. section 360bbb-3(b)(1), unless the authorization is terminated or revoked sooner. Performed at [facility], [address] [place],  [address]   Urine culture     Status: None   Collection Time: 06/16/2019  8:01 AM   Specimen: Urine, Random  Result Value Ref Range Status   Specimen Description   Final    URINE, RANDOM  Performed at [facility], [address] [place], [place] [address]    Special Requests   Final    NONE Performed at [facility], [address], [place], [place] [address]    Culture   Final    NO GROWTH Performed at [facility], [place] [address], [place],  [address]    Report Status 07/14/2019 FINAL  Final      Radiology Studies: No results found.  Marzetta Board, MD, PhD [facility]  Contact via  [email or web address]  [place] P: [phone] F: [phone]

## 2019-07-16 LAB — COMPREHENSIVE METABOLIC PANEL
ALT: 19 U/L (ref 0–44)
AST: 22 U/L (ref 15–41)
Albumin: 2.5 g/dL — ABNORMAL LOW (ref 3.5–5.0)
Alkaline Phosphatase: 49 U/L (ref 38–126)
Anion gap: 8 (ref 5–15)
BUN: 37 mg/dL — ABNORMAL HIGH (ref 8–23)
CO2: 23 mmol/L (ref 22–32)
Calcium: 7.5 mg/dL — ABNORMAL LOW (ref 8.9–10.3)
Chloride: 119 mmol/L — ABNORMAL HIGH (ref 98–111)
Creatinine, Ser: 1.04 mg/dL (ref 0.61–1.24)
GFR calc Af Amer: >60 mL/min (ref >60)
GFR calc non Af Amer: 59 mL/min — ABNORMAL LOW (ref >60)
Glucose, Bld: 141 mg/dL — ABNORMAL HIGH (ref 70–99)
Potassium: 3.5 mmol/L (ref 3.5–5.1)
Sodium: 150 mmol/L — ABNORMAL HIGH (ref 135–145)
Total Bilirubin: 0.9 mg/dL (ref 0.3–1.2)
Total Protein: 5.1 g/dL — ABNORMAL LOW (ref 6.5–8.1)

## 2019-07-16 LAB — CBC WITH DIFFERENTIAL/PLATELET
Abs Immature Granulocytes: 0.11 10*3/uL — ABNORMAL HIGH (ref 0.00–0.07)
Basophils Absolute: 0 10*3/uL (ref 0.0–0.1)
Basophils Relative: 0 %
Eosinophils Absolute: 0 10*3/uL (ref 0.0–0.5)
Eosinophils Relative: 0 %
HCT: 38.3 % — ABNORMAL LOW (ref 39.0–52.0)
Hemoglobin: 12.1 g/dL — ABNORMAL LOW (ref 13.0–17.0)
Immature Granulocytes: 1 %
Lymphocytes Relative: 2 %
Lymphs Abs: 0.2 10*3/uL — ABNORMAL LOW (ref 0.7–4.0)
MCH: 30.8 pg (ref 26.0–34.0)
MCHC: 31.6 g/dL (ref 30.0–36.0)
MCV: 97.5 fL (ref 80.0–100.0)
Monocytes Absolute: 0.3 10*3/uL (ref 0.1–1.0)
Monocytes Relative: 2 %
Neutro Abs: 10.9 10*3/uL — ABNORMAL HIGH (ref 1.7–7.7)
Neutrophils Relative %: 95 %
Platelets: 172 10*3/uL (ref 150–400)
RBC: 3.93 MIL/uL — ABNORMAL LOW (ref 4.22–5.81)
RDW: 14.7 % (ref 11.5–15.5)
WBC: 11.5 10*3/uL — ABNORMAL HIGH (ref 4.0–10.5)
nRBC: 0.2 % (ref 0.0–0.2)

## 2019-07-16 LAB — C-REACTIVE PROTEIN: CRP: 12.1 mg/dL — ABNORMAL HIGH (ref <1.0)

## 2019-07-16 LAB — D-DIMER, QUANTITATIVE: D-Dimer, Quant: 9.26 ug/mL-FEU — ABNORMAL HIGH (ref 0.00–0.50)

## 2019-07-16 NOTE — Progress Notes (Signed)
PROGRESS NOTE  Jeffery Walsh. [id number] DOB: 01/02/1918 DOA: 06/14/2019 PCP: Juluis Pitch, MD   LOS: 3 days   Brief Narrative / Interim history: Jeffery Samra. is a 85 y.o. male with medical history significant of BPH, prior CVA who comes to the ER from his SNF ([facility]) with complaints of shortness of breath. He has a degree from chronic hypoxia on 2L [place] and reportedly he has had low oxygen levels at the SNF requiring increasing his O2 to 4L but still sats in the 80s thus he was sent to ER  Subjective / 24h Interval events: -unresponsive this morning  Assessment & Plan: Active Problems:   Lymphoma ([place])   Bundle branch block, right   Benign prostatic hyperplasia   History of CVA (cerebrovascular accident)   Hypoxia   COVID-19 virus infection   Acute hypoxemic respiratory failure due to COVID-19 [facility])   Principal Problem Acute hypoxic respiratory failure due to COVID-19 pneumonia -poorly responsive this morning, on 15L NRB but sats in the 70s -He was started on remdesivir as well as steroids on 10/31 however due to black stools and concerns for GI bleed steroids have now been discontinued.  Lovenox as well as aspirin was discontinued as well -completed Remdesivir -He has not responded to treatment and slowly deteriorating, not able to maintain sats on 15 L of oxygen, anticipate in hospital death  Active Problems Melanotic stools, concern for upper GI bleed -Patient without a history of upper GI bleed but has been on chronic aspirin.  Hemoglobin decreased a little bit but stable and patient does not need blood transfusions.  Lovenox, aspirin as well as steroids have been discontinued -on PPI  Goals of care -He is DNR/DNI, no escalation of care/ICU and no aggressive measures/procedures -Continue remdesivir, PPI and periodic blood monitoring however despite treatment is progressively getting worse, now hypoxic. -Appears comfortable  Scheduled Meds: .  pantoprazole (PROTONIX) IV  40 mg Intravenous Q12H  . tamsulosin  0.4 mg Oral BID  . vitamin C  500 mg Oral Daily  . zinc sulfate  220 mg Oral Daily   Continuous Infusions: . remdesivir 100 mg in NS 250 mL 100 mg (07/15/19 1658)   PRN Meds:.acetaminophen, chlorpheniramine-HYDROcodone, guaiFENesin-dextromethorphan, LORazepam, morphine injection, ondansetron **OR** ondansetron (ZOFRAN) IV  DVT prophylaxis: SCDs Code Status: DNR Family Communication: d/w daughter Jeffery Walsh over the phone, [phone] Disposition Plan: TBD  Consultants:  None   Procedures:  None   Microbiology  None   Antimicrobials: None     Objective: Vitals:   07/15/19 1548 07/15/19 2000 07/15/19 2030 07/16/19 0420  BP: (!) 152/68  111/77 (!) 129/55  Pulse: 79  89 84  Resp: (!) 23  (!) 36 (!) 26  Temp: 98.3 F (36.8 C) 97.8 F (36.6 C)  (!) 97.4 F (36.3 C)  TempSrc: Axillary Axillary  Axillary  SpO2: 94%  92% (!) 83%  Weight:       No intake or output data in the 24 hours ending 07/16/19 0932 Filed Weights   07/12/2019 1832  Weight: 68.1 kg    Examination:  Constitutional: unresponsive ENMT: dry mucous membranes Respiratory: breathing unlabored Cardiovascular: RRR  Data Reviewed: I have independently reviewed following labs and imaging studies   CBC: Recent Labs  Lab 07/10/2019 0442 07/11/2019 1858 07/14/19 0029 07/14/19 0530 07/15/19 0537 07/16/19 0018  WBC 8.1 15.7* 8.3  --  11.7* 11.5*  NEUTROABS 7.5 14.5* 7.8*  --  10.9* 10.9*  HGB 13.0 14.2  12.1* 12.5* 12.4* 12.1*  HCT 38.9* 43.9 38.2* 39.6 38.4* 38.3*  MCV 92.8 98.0 97.4  --  96.7 97.5  PLT 131* 194 134*  --  167 [id number]   Basic Metabolic Panel: Recent Labs  Lab 07/12/2019 0442 06/20/2019 1858 07/14/19 0029 07/14/19 0308 07/15/19 0537 07/16/19 0018  NA 137 142 142  --  146* 150*  K 4.0 4.4 3.8  --  3.8 3.5  CL 104 109 110  --  112* 119*  CO2 22 17* 18*  --  22 23  GLUCOSE 157* 164* 143*  --  127* 141*  BUN 43* 46* 55*   --  46* 37*  CREATININE 1.01 1.06 1.12  --  0.98 1.04  CALCIUM 7.9* 7.7* 7.4*  --  7.5* 7.5*  MG  --   --   --  2.4  --   --    GFR: Estimated Creatinine Clearance: 37.3 mL/min (by C-G formula based on SCr of 1.04 mg/dL). Liver Function Tests: Recent Labs  Lab 06/16/2019 0442 07/02/2019 1858 07/14/19 0029 07/15/19 0537 07/16/19 0018  AST 37 39 33 27 22  ALT 37 37 32 24 19  ALKPHOS 44 51 42 45 49  BILITOT 0.7 0.7 0.5 0.5 0.9  PROT 5.5* 6.0* 5.0* 5.4* 5.1*  ALBUMIN 2.9* 2.9* 2.5* 2.7* 2.5*   No results for input(s): LIPASE, AMYLASE in the last 168 hours. No results for input(s): AMMONIA in the last 168 hours. Coagulation Profile: No results for input(s): INR, PROTIME in the last 168 hours. Cardiac Enzymes: No results for input(s): CKTOTAL, CKMB, CKMBINDEX, TROPONINI in the last 168 hours. BNP (last 3 results) No results for input(s): PROBNP in the last 8760 hours. HbA1C: No results for input(s): HGBA1C in the last 72 hours. CBG: No results for input(s): GLUCAP in the last 168 hours. Lipid Profile: No results for input(s): CHOL, HDL, LDLCALC, TRIG, CHOLHDL, LDLDIRECT in the last 72 hours. Thyroid Function Tests: No results for input(s): TSH, T4TOTAL, FREET4, T3FREE, THYROIDAB in the last 72 hours. Anemia Panel: No results for input(s): VITAMINB12, FOLATE, FERRITIN, TIBC, IRON, RETICCTPCT in the last 72 hours. Urine analysis:    Component Value Date/Time   COLORURINE YELLOW (A) 07/05/2019 0801   APPEARANCEUR HAZY (A) 06/16/2019 0801   APPEARANCEUR Clear 10/11/2017 1528   LABSPEC 1.026 07/01/2019 0801   PHURINE 5.0 06/13/2019 0801   GLUCOSEU NEGATIVE 06/24/2019 0801   HGBUR NEGATIVE 06/26/2019 0801   BILIRUBINUR NEGATIVE 06/13/2019 0801   BILIRUBINUR Negative 10/11/2017 1528   KETONESUR NEGATIVE 07/08/2019 0801   PROTEINUR 30 (A) 06/26/2019 0801   NITRITE NEGATIVE 07/09/2019 0801   LEUKOCYTESUR MODERATE (A) 07/10/2019 0801   Sepsis Labs: Invalid input(s):  PROCALCITONIN, LACTICIDVEN  Recent Results (from the past 240 hour(s))  Culture, blood (routine x 2)     Status: None (Preliminary result)   Collection Time: 06/24/2019  4:42 AM   Specimen: BLOOD  Result Value Ref Range Status   Specimen Description BLOOD LEFT FOREARM ANTERIOR  Final   Special Requests   Final    BOTTLES DRAWN AEROBIC AND ANAEROBIC Blood Culture adequate volume   Culture   Final    NO GROWTH 3 DAYS Performed at [facility], [address] [place], [place] [address]    Report Status PENDING  Incomplete  Culture, blood (routine x 2)     Status: None (Preliminary result)   Collection Time: 06/28/2019  4:42 AM   Specimen: BLOOD  Result Value Ref Range Status  Specimen Description BLOOD LEFT FOREARM POSTERIAL  Final   Special Requests   Final    BOTTLES DRAWN AEROBIC AND ANAEROBIC Blood Culture adequate volume   Culture   Final    NO GROWTH 3 DAYS Performed at [facility], [address] [place], [place] [address]    Report Status PENDING  Incomplete  SARS Coronavirus 2 by RT PCR (hospital order, performed in [facility] hospital lab) Nasopharyngeal Nasopharyngeal Swab     Status: Abnormal   Collection Time: 07/03/2019  4:42 AM   Specimen: Nasopharyngeal Swab  Result Value Ref Range Status   SARS Coronavirus 2 POSITIVE (A) NEGATIVE Final    Comment: RESULT CALLED TO, READ BACK BY AND VERIFIED WITH: REBECCA LYNN AT [id number] 06/29/2019.PMF (NOTE) If result is NEGATIVE SARS-CoV-2 target nucleic acids are NOT DETECTED. The SARS-CoV-2 RNA is generally detectable in upper and lower  respiratory specimens during the acute phase of infection. The lowest  concentration of SARS-CoV-2 viral copies this assay can detect is 250  copies / mL. A negative result does not preclude SARS-CoV-2 infection  and should not be used as the sole basis for treatment or other  patient management decisions.  A negative result may occur with  improper specimen collection /  handling, submission of specimen other  than nasopharyngeal swab, presence of viral mutation(s) within the  areas targeted by this assay, and inadequate number of viral copies  (<250 copies / mL). A negative result must be combined with clinical  observations, patient history, and epidemiological information. If result is POSITIVE SARS-CoV-2 target nucleic acids are DETECTED. T he SARS-CoV-2 RNA is generally detectable in upper and lower  respiratory specimens during the acute phase of infection.  Positive  results are indicative of active infection with SARS-CoV-2.  Clinical  correlation with patient history and other diagnostic information is  necessary to determine patient infection status.  Positive results do  not rule out bacterial infection or Walsh-infection with other viruses. If result is PRESUMPTIVE POSTIVE SARS-CoV-2 nucleic acids MAY BE PRESENT.   A presumptive positive result was obtained on the submitted specimen  and confirmed on repeat testing.  While 2019 novel coronavirus  (SARS-CoV-2) nucleic acids may be present in the submitted sample  additional confirmatory testing may be necessary for epidemiological  and / or clinical management purposes  to differentiate between  SARS-CoV-2 and other Sarbecovirus currently known to infect humans.  If clinically indicated additional testing with an alternate test  methodology [phone]) is  advised. The SARS-CoV-2 RNA is generally  detectable in upper and lower respiratory specimens during the acute  phase of infection. The expected result is Negative. Fact Sheet for Patients:  StrictlyIdeas.no Fact Sheet for Healthcare Providers: BankingDealers.Walsh.za This test is not yet approved or cleared by the [place] FDA and has been authorized for detection and/or diagnosis of SARS-CoV-2 by FDA under an Emergency Use Authorization (EUA).  This EUA will remain in effect (meaning this  test can be used) for the duration of the COVID-19 declaration under Section 564(b)(1) of the Act, 21 U.S.C. section 360bbb-3(b)(1), unless the authorization is terminated or revoked sooner. Performed at [facility], [address] [place], [place] [address]   Urine culture     Status: None   Collection Time: 07/09/2019  8:01 AM   Specimen: Urine, Random  Result Value Ref Range Status   Specimen Description   Final    URINE, RANDOM Performed at [facility], [address],  [place], [place] [address]    Special Requests   Final    NONE Performed at [facility], [address] [place], [place] [address]    Culture   Final    NO GROWTH Performed at [facility], [place] [address], [place], [place] [address]    Report Status 07/14/2019 FINAL  Final      Radiology Studies: No results found.  Marzetta Board, MD, PhD [facility]  Contact via  [email or web address]  [place] P: [phone] F: [phone]

## 2019-07-16 NOTE — Progress Notes (Signed)
Jeffery Walsh (son) updated via telephone [phone].

## 2019-07-17 MED ORDER — MORPHINE 100MG IN NS 100ML (1MG/ML) PREMIX INFUSION
3.0000 mg/h | INTRAVENOUS | Status: DC
  Administered 2019-07-17: 2 mg/h via INTRAVENOUS
  Filled 2019-07-17: qty 100

## 2019-07-18 LAB — CULTURE, BLOOD (ROUTINE X 2)
Culture: NO GROWTH
Culture: NO GROWTH
Special Requests: ADEQUATE
Special Requests: ADEQUATE

## 2019-08-13 NOTE — Death Summary Note (Addendum)
Rn entered room to find pt was absent of respirations. Time of death 42 Verified with Psychologist, educational.   Morphine gtt wasted with Candace Cruise RN, approx 90cc remaining. Wasted in steri cycle.

## 2019-08-13 NOTE — Progress Notes (Signed)
Patient oxygen sats 82-85%, with NRB 15L, respirations labored,with use of  accessory muscle, Prn Morphine 2mg  slow IVP administered for breathing difficulties,  Oxygen splitter obtained, placed NRB 15L with 6LNC pt sats remain 86%  Labored with use of accessory muscles resp  Rate 50's. Dr Myna Hidalgo notified of same. New orders received. Will continue to monitor and provide support.

## 2019-08-13 NOTE — Discharge Summary (Signed)
Physician Discharge Summary  Jeffery Walsh. [id number] DOB: 11/18/1917 DOA: 07-29-2019  PCP: Juluis Pitch, MD  Admit date: 2019-07-29 Discharge date: 2019/08/02   Discharge Diagnoses:  Active Problems:   Lymphoma ([place])   Bundle branch block, right   Benign prostatic hyperplasia   History of CVA (cerebrovascular accident)   Hypoxia   COVID-19 virus infection   Acute hypoxemic respiratory failure due to COVID-19 [facility])   Discharge Condition: deceased  Filed Weights   Jul 29, 2019 1832 August 02, 2019 1120  Weight: 68.1 kg 68.1 kg    History of present illness:  Jeffery Walshis Jeffery Walsh 85 y.o.malewith medical history significant ofBPH, prior CVA who comes to the ER from his SNF ([facility]) with complaints of shortness of breath. He has Jeffery Walsh degree from chronic hypoxia on 2L [place] and reportedly he has had low oxygen levels at the SNF requiring increasing his O2 to 4L but still sats in the 80s thus he was sent to ER.  He was found to have COVID 19 pneumonia and was treated with steroids and remdesivir.  He developed melanotic stools and concern for an upper GI bleed and his steroids were discontinued as well as aspirin and lovenox.  He was on Sheryll Dymek PPI during the hospitalization.  Due to progressive worsening, he was transitioned to comfort care and passed away on 08-02-2023 AM.   See previous notes for additional details regarding hospital course.  Hospital Course:  See previous notes  Procedures:  none  Consultations:  none  Discharge Exam: Vitals:   02-Aug-2019 0318 08-02-2019 0512  BP:    Pulse:    Resp:  (!) 33  Temp: (!) 101 F (38.3 C)   SpO2:  (!) 88%   Pt unresponsive on O2 Discussed with pt son after death.  He expressed understanding and thanks to care team.  General: No acute distress. Lungs: increased WOB Abdomen: Soft, nontender, nondistended Neurological: unresponsive Skin: Warm and dry. No rashes or lesions. Extremities: No clubbing or cyanosis. No edema.     Discharge Instructions    Allergies as of Aug 02, 2019      Reactions   Aspirin Nausea Only   In high doses      Medication List    ASK your doctor about these medications   acetaminophen 500 MG tablet Commonly known as: TYLENOL Take 500 mg by mouth every 4 (four) hours as needed (pain in finger(s)).   aspirin 81 MG tablet Take 81 mg by mouth daily.   calcium carbonate 1250 (500 Ca) MG chewable tablet Commonly known as: OS-CAL Chew 1 tablet by mouth daily.   carbamide peroxide 6.5 % OTIC solution Commonly known as: DEBROX Place 5 drops into both ears daily as needed (impacted cerumen).   cefTRIAXone 1 g injection Commonly known as: ROCEPHIN Inject 1 g into the muscle daily. Ask about: Should I take this medication?   dexamethasone 6 MG tablet Commonly known as: DECADRON Take 6 mg by mouth daily.   multivitamin with minerals Tabs tablet Take 1 tablet by mouth daily.   nitrofurantoin (macrocrystal-monohydrate) 100 MG capsule Commonly known as: MACROBID Take 100 mg by mouth 2 (two) times daily.   tamsulosin 0.4 MG Caps capsule Commonly known as: FLOMAX Take 0.4 mg by mouth 2 (two) times daily.      Allergies  Allergen Reactions  . Aspirin Nausea Only    In high doses      The results of significant diagnostics from this hospitalization (including imaging, microbiology, ancillary and  laboratory) are listed below for reference.    Significant Diagnostic Studies: Dg Chest Port 1 View  Result Date: 07/07/2019 CLINICAL DATA:  Shortness of breath EXAM: PORTABLE CHEST 1 VIEW COMPARISON:  03/22/2016 FINDINGS: Cardiomediastinal contours are normal. There is calcific aortic atherosclerosis. Hazy opacity in the right mid lung. No pulmonary edema. No pleural effusion or pneumothorax. IMPRESSION: Hazy opacity in the right mid lung may indicate developing consolidation. Electronically Signed   By: Ulyses Jarred M.D.   On: 06/27/2019 04:36    Microbiology: Recent  Results (from the past 240 hour(s))  Culture, blood (routine x 2)     Status: None (Preliminary result)   Collection Time: 06/23/2019  4:42 AM   Specimen: BLOOD  Result Value Ref Range Status   Specimen Description BLOOD LEFT FOREARM ANTERIOR  Final   Special Requests   Final    BOTTLES DRAWN AEROBIC AND ANAEROBIC Blood Culture adequate volume   Culture   Final    NO GROWTH 4 DAYS Performed at [facility], [address] [place], [place] [address]    Report Status PENDING  Incomplete  Culture, blood (routine x 2)     Status: None (Preliminary result)   Collection Time: 07/12/2019  4:42 AM   Specimen: BLOOD  Result Value Ref Range Status   Specimen Description BLOOD LEFT FOREARM POSTERIAL  Final   Special Requests   Final    BOTTLES DRAWN AEROBIC AND ANAEROBIC Blood Culture adequate volume   Culture   Final    NO GROWTH 4 DAYS Performed at [facility], [address], [place], [place] [address]    Report Status PENDING  Incomplete  SARS Coronavirus 2 by RT PCR (hospital order, performed in [place] hospital lab) Nasopharyngeal Nasopharyngeal Swab     Status: Abnormal   Collection Time: 06/30/2019  4:42 AM   Specimen: Nasopharyngeal Swab  Result Value Ref Range Status   SARS Coronavirus 2 POSITIVE (Jeffery Walsh) NEGATIVE Final    Comment: RESULT CALLED TO, READ BACK BY AND VERIFIED WITH: REBECCA LYNN AT [id number] 07/08/2019.PMF (NOTE) If result is NEGATIVE SARS-CoV-2 target nucleic acids are NOT DETECTED. The SARS-CoV-2 RNA is generally detectable in upper and lower  respiratory specimens during the acute phase of infection. The lowest  concentration of SARS-CoV-2 viral copies this assay can detect is 250  copies / mL. Jeffery Walsh negative result does not preclude SARS-CoV-2 infection  and should not be used as the sole basis for treatment or other  patient management decisions.  Jeffery Walsh negative result may occur with  improper specimen collection / handling, submission of specimen other   than nasopharyngeal swab, presence of viral mutation(s) within the  areas targeted by this assay, and inadequate number of viral copies  (<250 copies / mL). Jeffery Walsh negative result must be combined with clinical  observations, patient history, and epidemiological information. If result is POSITIVE SARS-CoV-2 target nucleic acids are DETECTED. T he SARS-CoV-2 RNA is generally detectable in upper and lower  respiratory specimens during the acute phase of infection.  Positive  results are indicative of active infection with SARS-CoV-2.  Clinical  correlation with patient history and other diagnostic information is  necessary to determine patient infection status.  Positive results do  not rule out bacterial infection or co-infection with other viruses. If result is PRESUMPTIVE POSTIVE SARS-CoV-2 nucleic acids MAY BE PRESENT.   Jeffery Walsh presumptive positive result was obtained on the submitted specimen  and confirmed on repeat testing.  While 2019 novel coronavirus  (  SARS-CoV-2) nucleic acids may be present in the submitted sample  additional confirmatory testing may be necessary for epidemiological  and / or clinical management purposes  to differentiate between  SARS-CoV-2 and other Sarbecovirus currently known to infect humans.  If clinically indicated additional testing with an alternate test  methodology [phone]) is  advised. The SARS-CoV-2 RNA is generally  detectable in upper and lower respiratory specimens during the acute  phase of infection. The expected result is Negative. Fact Sheet for Patients:  StrictlyIdeas.no Fact Sheet for Healthcare Providers: BankingDealers.co.za This test is not yet approved or cleared by the [place] FDA and has been authorized for detection and/or diagnosis of SARS-CoV-2 by FDA under an Emergency Use Authorization (EUA).  This EUA will remain in effect (meaning this test can be used) for the duration of  the COVID-19 declaration under Section 564(b)(1) of the Act, 21 U.S.C. section 360bbb-3(b)(1), unless the authorization is terminated or revoked sooner. Performed at [facility], [address] [place], [place] [address]   Urine culture     Status: None   Collection Time: 07/03/2019  8:01 AM   Specimen: Urine, Random  Result Value Ref Range Status   Specimen Description   Final    URINE, RANDOM Performed at [facility], [address], [place], [place] [address]    Special Requests   Final    NONE Performed at [facility], [address], [place], [place] [address]    Culture   Final    NO GROWTH Performed at [facility], [place] [address] [place],  [address]    Report Status 07/14/2019 FINAL  Final     Labs: Basic Metabolic Panel: Recent Labs  Lab 07/04/2019 0442 06/19/2019 1858 07/14/19 0029 07/14/19 0308 07/15/19 0537 07/16/19 0018  NA 137 142 142  --  146* 150*  K 4.0 4.4 3.8  --  3.8 3.5  CL 104 109 110  --  112* 119*  CO2 22 17* 18*  --  22 23  GLUCOSE 157* 164* 143*  --  127* 141*  BUN 43* 46* 55*  --  46* 37*  CREATININE 1.01 1.06 1.12  --  0.98 1.04  CALCIUM 7.9* 7.7* 7.4*  --  7.5* 7.5*  MG  --   --   --  2.4  --   --    Liver Function Tests: Recent Labs  Lab 06/27/2019 0442 06/17/2019 1858 07/14/19 0029 07/15/19 0537 07/16/19 0018  AST 37 39 33 27 22  ALT 37 37 32 24 19  ALKPHOS 44 51 42 45 49  BILITOT 0.7 0.7 0.5 0.5 0.9  PROT 5.5* 6.0* 5.0* 5.4* 5.1*  ALBUMIN 2.9* 2.9* 2.5* 2.7* 2.5*   No results for input(s): LIPASE, AMYLASE in the last 168 hours. No results for input(s): AMMONIA in the last 168 hours. CBC: Recent Labs  Lab 06/26/2019 0442 06/13/2019 1858 07/14/19 0029 07/14/19 0530 07/15/19 0537 07/16/19 0018  WBC 8.1 15.7* 8.3  --  11.7* 11.5*  NEUTROABS 7.5 14.5* 7.8*  --  10.9* 10.9*  HGB 13.0 14.2 12.1* 12.5* 12.4* 12.1*  HCT 38.9* 43.9 38.2* 39.6 38.4* 38.3*  MCV 92.8 98.0 97.4  --  96.7  97.5  PLT 131* 194 134*  --  167 172   Cardiac Enzymes: No results for input(s): CKTOTAL, CKMB, CKMBINDEX, TROPONINI in the last 168 hours. BNP: BNP (last 3 results) No results for input(s): BNP in the last 8760 hours.  ProBNP (  last 3 results) No results for input(s): PROBNP in the last 8760 hours.  CBG: No results for input(s): GLUCAP in the last 168 hours.     Signed:  Fayrene Helper MD.  [facility] Jul 22, 2019, 4:24 PM

## 2019-08-13 NOTE — Progress Notes (Signed)
   2019/08/07 1100  Clinical Encounter Type  Visited With Family  Visit Type Initial;Psychological support;Spiritual support  Referral From Family  Consult/Referral To Chaplain  Spiritual Encounters  Spiritual Needs Prayer;Emotional;Grief support  Stress Factors  Family Stress Factors Loss   I spoke with the patient's granddaughter, Selena Batten, right after the patient passed. I provided grief support and prayer for her. Selena Batten is one of our [place] nurses and was at work when she found out about her grandfather passing away.    Chaplain Shanon Ace M.Div., [facility]

## 2019-08-13 NOTE — Progress Notes (Signed)
Oxygen sats remain 84-86% via NRB 15L,&6LNC respirations 20's-30s labored,with use of  accessory muscle,improvement noted with  Morphine gtt infusing @ 2mg /hr as ordered. No oral intake this shift. Oral care provided throughout  shift.  Repositioned as tolerated. Will continue to monitor and provided support.

## 2019-08-13 NOTE — Death Summary Note (Signed)
DEATH SUMMARY   Patient Details  Name: Jeffery Walsh. MRN: [id number] DOB: 30-Nov-1917  Admission/Discharge Information   Admit Date:  Aug 05, 2019  Date of Death: Date of Death: 2019/08/09  Time of Death: Time of Death: 1120  Length of Stay: 4  Referring Physician: Juluis Pitch, MD   Reason(s) for Hospitalization  COVID 19 Pneumonia  Diagnoses  Preliminary cause of death:  Secondary Diagnoses (including complications and co-morbidities):  Active Problems:   Lymphoma ([place])   Bundle branch block, right   Benign prostatic hyperplasia   History of CVA (cerebrovascular accident)   Hypoxia   COVID-19 virus infection   Acute hypoxemic respiratory failure due to COVID-19 ([place])  Jeffery Walshis Jeffery Walsh 85 y.o.malewith medical history significant ofBPH, prior CVA who comes to the ER from his SNF ([facility]) with complaints of shortness of breath. He has Jeffery Walsh degree from chronic hypoxia on 2L [place] and reportedly he has had low oxygen levels at the SNF requiring increasing his O2 to 4L but still sats in the 80s thus he was sent to ER.  He was found to have COVID 19 pneumonia and was treated with steroids and remdesivir.  He developed melanotic stools and concern for an upper GI bleed and his steroids were discontinued as well as aspirin and lovenox.  He was on Jeffery Walsh PPI during the hospitalization.  Due to progressive worsening, he was transitioned to comfort care and passed away on Aug 09, 2023 AM.   See previous notes for additional details regarding hospital course.   Pertinent Labs and Studies  Significant Diagnostic Studies Dg Chest Port 1 View  Result Date: 08-05-2019 CLINICAL DATA:  Shortness of breath EXAM: PORTABLE CHEST 1 VIEW COMPARISON:  03/22/2016 FINDINGS: Cardiomediastinal contours are normal. There is calcific aortic atherosclerosis. Hazy opacity in the right mid lung. No pulmonary edema. No pleural effusion or pneumothorax. IMPRESSION: Hazy opacity in the right mid lung may  indicate developing consolidation. Electronically Signed   By: Ulyses Jarred M.D.   On: 2019/08/05 04:36    Microbiology Recent Results (from the past 240 hour(s))  Culture, blood (routine x 2)     Status: None (Preliminary result)   Collection Time: August 05, 2019  4:42 AM   Specimen: BLOOD  Result Value Ref Range Status   Specimen Description BLOOD LEFT FOREARM ANTERIOR  Final   Special Requests   Final    BOTTLES DRAWN AEROBIC AND ANAEROBIC Blood Culture adequate volume   Culture   Final    NO GROWTH 4 DAYS Performed at [facility], [address], [place], [place] [address]    Report Status PENDING  Incomplete  Culture, blood (routine x 2)     Status: None (Preliminary result)   Collection Time: 08-05-2019  4:42 AM   Specimen: BLOOD  Result Value Ref Range Status   Specimen Description BLOOD LEFT FOREARM POSTERIAL  Final   Special Requests   Final    BOTTLES DRAWN AEROBIC AND ANAEROBIC Blood Culture adequate volume   Culture   Final    NO GROWTH 4 DAYS Performed at [facility], [address], [place], [place] [address]    Report Status PENDING  Incomplete  SARS Coronavirus 2 by RT PCR (hospital order, performed in [place] hospital lab) Nasopharyngeal Nasopharyngeal Swab     Status: Abnormal   Collection Time: Aug 05, 2019  4:42 AM   Specimen: Nasopharyngeal Swab  Result Value Ref Range Status   SARS Coronavirus 2 POSITIVE ([place]) NEGATIVE Final  Comment: RESULT CALLED TO, READ BACK BY AND VERIFIED WITH: Jeffery Walsh AT [id number] 07/04/2019.PMF (NOTE) If result is NEGATIVE SARS-CoV-2 target nucleic acids are NOT DETECTED. The SARS-CoV-2 RNA is generally detectable in upper and lower  respiratory specimens during the acute phase of infection. The lowest  concentration of SARS-CoV-2 viral copies this assay can detect is 250  copies / mL. Jeffery Walsh negative result does not preclude SARS-CoV-2 infection  and should not be used as the sole basis for treatment or other   patient management decisions.  Jeffery Walsh negative result may occur with  improper specimen collection / handling, submission of specimen other  than nasopharyngeal swab, presence of viral mutation(s) within the  areas targeted by this assay, and inadequate number of viral copies  (<250 copies / mL). Jeffery Walsh negative result must be combined with clinical  observations, patient history, and epidemiological information. If result is POSITIVE SARS-CoV-2 target nucleic acids are DETECTED. T he SARS-CoV-2 RNA is generally detectable in upper and lower  respiratory specimens during the acute phase of infection.  Positive  results are indicative of active infection with SARS-CoV-2.  Clinical  correlation with patient history and other diagnostic information is  necessary to determine patient infection status.  Positive results do  not rule out bacterial infection or co-infection with other viruses. If result is PRESUMPTIVE POSTIVE SARS-CoV-2 nucleic acids MAY BE PRESENT.   Jeffery Walsh presumptive positive result was obtained on the submitted specimen  and confirmed on repeat testing.  While 2019 novel coronavirus  (SARS-CoV-2) nucleic acids may be present in the submitted sample  additional confirmatory testing may be necessary for epidemiological  and / or clinical management purposes  to differentiate between  SARS-CoV-2 and other Sarbecovirus currently known to infect humans.  If clinically indicated additional testing with an alternate test  methodology [phone]) is  advised. The SARS-CoV-2 RNA is generally  detectable in upper and lower respiratory specimens during the acute  phase of infection. The expected result is Negative. Fact Sheet for Patients:  StrictlyIdeas.no Fact Sheet for Healthcare Providers: BankingDealers.co.za This test is not yet approved or cleared by the [place] FDA and has been authorized for detection and/or diagnosis of SARS-CoV-2  by FDA under an Emergency Use Authorization (EUA).  This EUA will remain in effect (meaning this test can be used) for the duration of the COVID-19 declaration under Section 564(b)(1) of the Act, 21 U.S.C. section 360bbb-3(b)(1), unless the authorization is terminated or revoked sooner. Performed at [facility], [address] [place], [place] [address]   Urine culture     Status: None   Collection Time: 06/25/2019  8:01 AM   Specimen: Urine, Random  Result Value Ref Range Status   Specimen Description   Final    URINE, RANDOM Performed at [facility], [address] [place], [place] [address]    Special Requests   Final    NONE Performed at [facility], [address], [place], [place] [address]    Culture   Final    NO GROWTH Performed at [facility], [place] [address] [place],  [address]    Report Status 07/14/2019 FINAL  Final    Lab Basic Metabolic Panel: Recent Labs  Lab 06/29/2019 0442 06/21/2019 1858 07/14/19 0029 07/14/19 0308 07/15/19 0537 07/16/19 0018  NA 137 142 142  --  146* 150*  K 4.0 4.4 3.8  --  3.8 3.5  CL 104 109 110  --  112* 119*  CO2 22 17* 18*  --  22 23  GLUCOSE 157* 164* 143*  --  127* 141*  BUN 43* 46* 55*  --  46* 37*  CREATININE 1.01 1.06 1.12  --  0.98 1.04  CALCIUM 7.9* 7.7* 7.4*  --  7.5* 7.5*  MG  --   --   --  2.4  --   --    Liver Function Tests: Recent Labs  Lab 06/26/2019 0442 07/09/2019 1858 07/14/19 0029 07/15/19 0537 07/16/19 0018  AST 37 39 33 27 22  ALT 37 37 32 24 19  ALKPHOS 44 51 42 45 49  BILITOT 0.7 0.7 0.5 0.5 0.9  PROT 5.5* 6.0* 5.0* 5.4* 5.1*  ALBUMIN 2.9* 2.9* 2.5* 2.7* 2.5*   No results for input(s): LIPASE, AMYLASE in the last 168 hours. No results for input(s): AMMONIA in the last 168 hours. CBC: Recent Labs  Lab 07/05/2019 0442 07/07/2019 1858 07/14/19 0029 07/14/19 0530 07/15/19 0537 07/16/19 0018  WBC 8.1 15.7* 8.3  --  11.7* 11.5*  NEUTROABS 7.5 14.5*  7.8*  --  10.9* 10.9*  HGB 13.0 14.2 12.1* 12.5* 12.4* 12.1*  HCT 38.9* 43.9 38.2* 39.6 38.4* 38.3*  MCV 92.8 98.0 97.4  --  96.7 97.5  PLT 131* 194 134*  --  167 172   Cardiac Enzymes: No results for input(s): CKTOTAL, CKMB, CKMBINDEX, TROPONINI in the last 168 hours. Sepsis Labs: Recent Labs  Lab 07/01/2019 0442 07/01/2019 0907 07/08/2019 1858 07/14/19 0029 07/14/19 0308 07/14/19 0530 07/15/19 0537 07/16/19 0018  WBC 8.1  --  15.7* 8.3  --   --  11.7* 11.5*  LATICACIDVEN 2.7* 2.2*  --   --  2.9* 3.2*  --   --     Procedures/Operations  See previous notes   Jeffery Walsh Jul 21, 2019, 11:48 AM

## 2019-08-13 DEATH — deceased

## 2019-10-17 ENCOUNTER — Ambulatory Visit: Payer: Medicare Other | Admitting: Urology

## 2020-11-26 IMAGING — DX DG CHEST 1V PORT
1 series · 1 of 1 positions shown · non-contrast
Comparison: 03/22/2016

CLINICAL DATA: Shortness of breath

EXAM:
PORTABLE CHEST 1 VIEW

[chest ap]
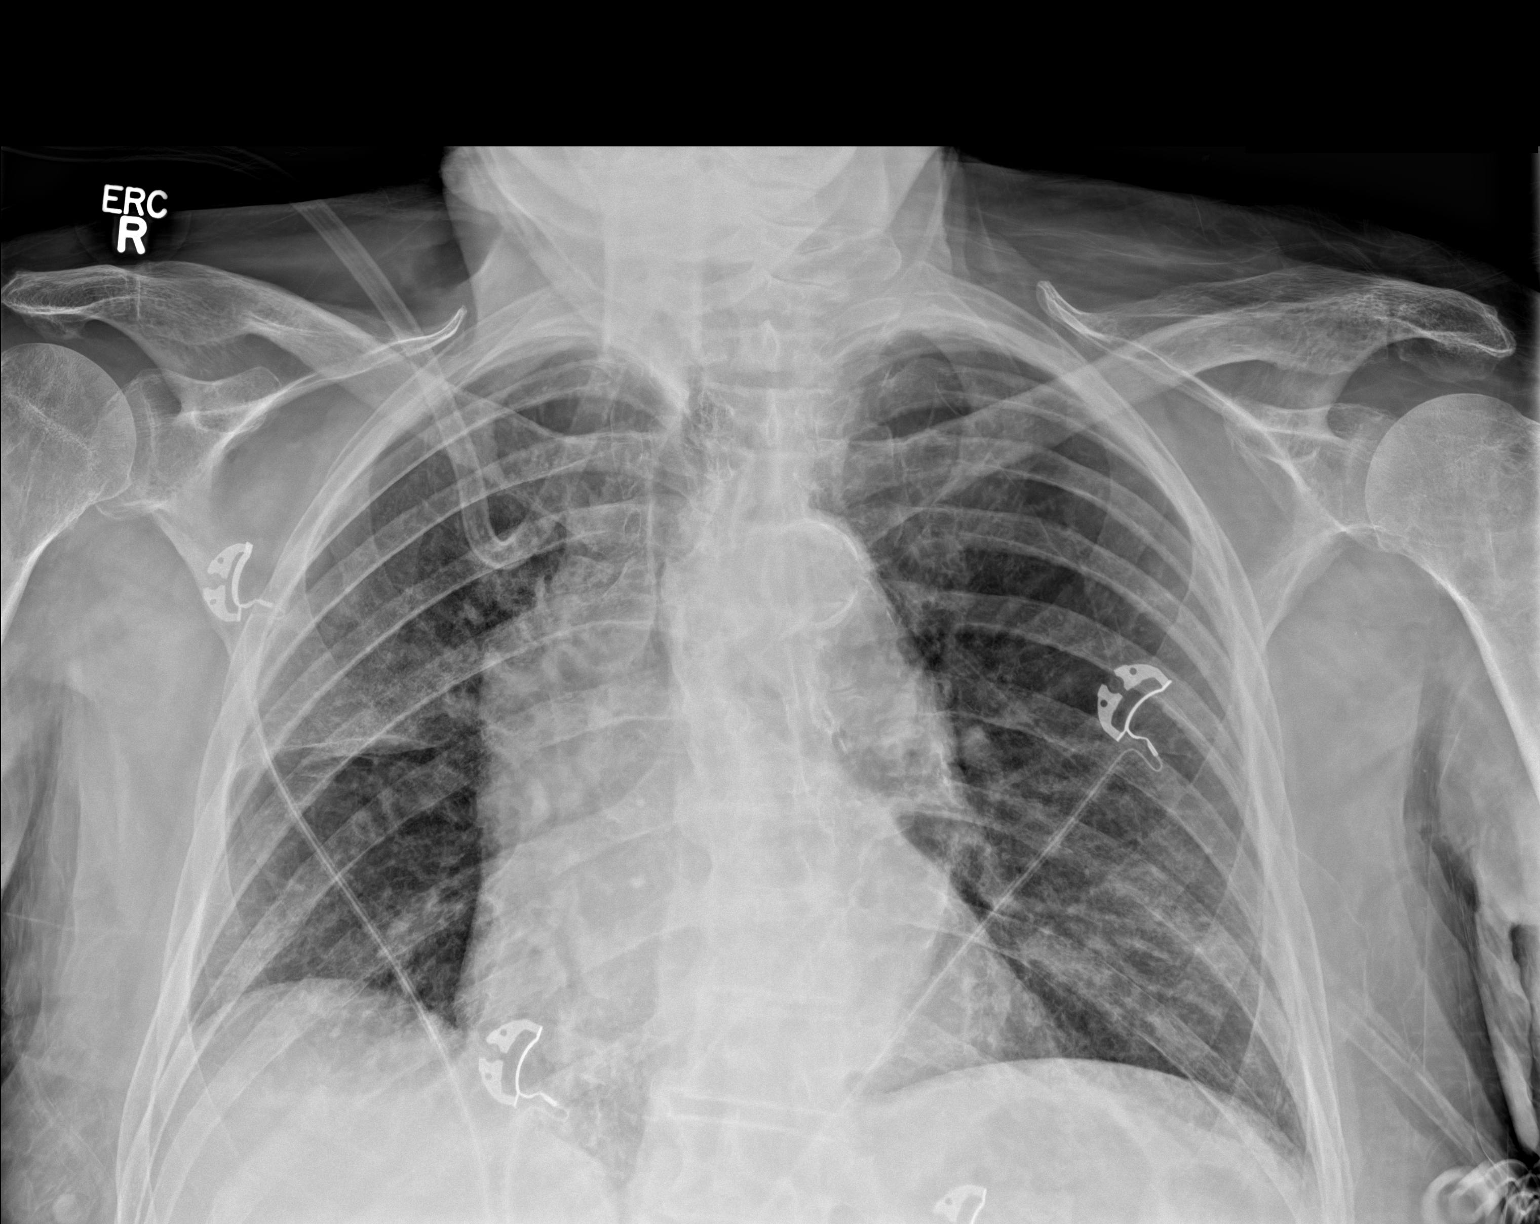

[1 of 1 positions shown; findings below may reference images not displayed]

FINDINGS: Cardiomediastinal contours are normal. There is calcific aortic
atherosclerosis. Hazy opacity in the right mid lung. No pulmonary
edema. No pleural effusion or pneumothorax.
IMPRESSION: Hazy opacity in the right mid lung may indicate developing
consolidation.
